# Patient Record
Sex: Male | Born: 1985 | Race: White | Hispanic: No | Marital: Married | Smoking: Never smoker
Health system: Southern US, Community
[De-identification: ages and names within clinical notes are randomized; demographics above are authoritative.]

## PROBLEM LIST (undated history)

## (undated) HISTORY — DX: Other disorders of bilirubin metabolism: E80.6

---

## 2010-07-29 ENCOUNTER — Emergency Department (HOSPITAL_COMMUNITY): Admission: EM | Admit: 2010-07-29 | Discharge: 2010-07-29 | Payer: Self-pay | Admitting: Emergency Medicine

## 2010-12-15 LAB — BASIC METABOLIC PANEL
BUN: 10 mg/dL (ref 6–23)
CO2: 27 mEq/L (ref 19–32)
Chloride: 105 mEq/L (ref 96–112)
Creatinine, Ser: 1.03 mg/dL (ref 0.4–1.5)
Glucose, Bld: 86 mg/dL (ref 70–99)

## 2010-12-15 LAB — DIFFERENTIAL
Basophils Relative: 2 % — ABNORMAL HIGH (ref 0–1)
Eosinophils Absolute: 0 10*3/uL (ref 0.0–0.7)
Eosinophils Relative: 1 % (ref 0–5)
Lymphs Abs: 1.2 10*3/uL (ref 0.7–4.0)

## 2010-12-15 LAB — CBC
MCH: 29.9 pg (ref 26.0–34.0)
MCHC: 34.5 g/dL (ref 30.0–36.0)
MCV: 86.7 fL (ref 78.0–100.0)
Platelets: 191 10*3/uL (ref 150–400)
RBC: 4.58 MIL/uL (ref 4.22–5.81)
RDW: 12.6 % (ref 11.5–15.5)

## 2010-12-15 LAB — URINALYSIS, ROUTINE W REFLEX MICROSCOPIC
Glucose, UA: NEGATIVE mg/dL
Ketones, ur: NEGATIVE mg/dL
pH: 7 (ref 5.0–8.0)

## 2010-12-15 LAB — POCT CARDIAC MARKERS

## 2014-03-28 HISTORY — DX: Other disorders of bilirubin metabolism: E80.6

## 2014-04-21 DIAGNOSIS — D239 Other benign neoplasm of skin, unspecified: Secondary | ICD-10-CM

## 2014-04-21 HISTORY — DX: Other benign neoplasm of skin, unspecified: D23.9

## 2015-12-24 DIAGNOSIS — M5459 Other low back pain: Secondary | ICD-10-CM | POA: Insufficient documentation

## 2015-12-24 DIAGNOSIS — M545 Low back pain: Secondary | ICD-10-CM | POA: Insufficient documentation

## 2017-05-02 ENCOUNTER — Other Ambulatory Visit: Payer: Self-pay

## 2017-05-02 DIAGNOSIS — Z0000 Encounter for general adult medical examination without abnormal findings: Secondary | ICD-10-CM

## 2017-05-02 LAB — POCT URINALYSIS DIPSTICK
Bilirubin, UA: NEGATIVE
GLUCOSE UA: NEGATIVE
KETONES UA: NEGATIVE
Leukocytes, UA: NEGATIVE
Nitrite, UA: NEGATIVE
PROTEIN UA: NEGATIVE
RBC UA: NEGATIVE
SPEC GRAV UA: 1.02 (ref 1.010–1.025)
UROBILINOGEN UA: 0.2 U/dL
pH, UA: 6 (ref 5.0–8.0)

## 2017-05-03 LAB — CMP12+LP+TP+TSH+6AC+CBC/D/PLT
ALK PHOS: 81 IU/L (ref 39–117)
ALT: 33 IU/L (ref 0–44)
AST: 25 IU/L (ref 0–40)
Albumin/Globulin Ratio: 1.7 (ref 1.2–2.2)
Albumin: 4.3 g/dL (ref 3.5–5.5)
BILIRUBIN TOTAL: 1.1 mg/dL (ref 0.0–1.2)
BUN/Creatinine Ratio: 14 (ref 9–20)
BUN: 14 mg/dL (ref 6–20)
Basophils Absolute: 0 10*3/uL (ref 0.0–0.2)
Basos: 1 %
CHLORIDE: 102 mmol/L (ref 96–106)
CHOLESTEROL TOTAL: 159 mg/dL (ref 100–199)
Calcium: 9.3 mg/dL (ref 8.7–10.2)
Chol/HDL Ratio: 3.1 ratio (ref 0.0–5.0)
Creatinine, Ser: 1.02 mg/dL (ref 0.76–1.27)
EOS (ABSOLUTE): 0.2 10*3/uL (ref 0.0–0.4)
Eos: 3 %
Estimated CHD Risk: <0.5 times avg. (ref 0.0–1.0)
FREE THYROXINE INDEX: 1.5 (ref 1.2–4.9)
GFR calc Af Amer: 113 mL/min/{1.73_m2} (ref >59)
GFR calc non Af Amer: 97 mL/min/{1.73_m2} (ref >59)
GGT: 21 IU/L (ref 0–65)
GLUCOSE: 92 mg/dL (ref 65–99)
Globulin, Total: 2.6 g/dL (ref 1.5–4.5)
HDL: 51 mg/dL (ref >39)
HEMATOCRIT: 46.2 % (ref 37.5–51.0)
Hemoglobin: 16.1 g/dL (ref 13.0–17.7)
IMMATURE GRANS (ABS): 0 10*3/uL (ref 0.0–0.1)
IMMATURE GRANULOCYTES: 0 %
Iron: 112 ug/dL (ref 38–169)
LDH: 170 IU/L (ref 121–224)
LDL CALC: 81 mg/dL (ref 0–99)
LYMPHS: 33 %
Lymphocytes Absolute: 2 10*3/uL (ref 0.7–3.1)
MCH: 30.3 pg (ref 26.6–33.0)
MCHC: 34.8 g/dL (ref 31.5–35.7)
MCV: 87 fL (ref 79–97)
MONOS ABS: 0.6 10*3/uL (ref 0.1–0.9)
Monocytes: 10 %
NEUTROS PCT: 53 %
Neutrophils Absolute: 3.3 10*3/uL (ref 1.4–7.0)
PLATELETS: 270 10*3/uL (ref 150–379)
Phosphorus: 4.5 mg/dL (ref 2.5–4.5)
Potassium: 4.4 mmol/L (ref 3.5–5.2)
RBC: 5.31 x10E6/uL (ref 4.14–5.80)
RDW: 13.6 % (ref 12.3–15.4)
Sodium: 142 mmol/L (ref 134–144)
T3 Uptake Ratio: 23 % — ABNORMAL LOW (ref 24–39)
T4 TOTAL: 6.4 ug/dL (ref 4.5–12.0)
TRIGLYCERIDES: 136 mg/dL (ref 0–149)
TSH: 1.1 u[IU]/mL (ref 0.450–4.500)
Total Protein: 6.9 g/dL (ref 6.0–8.5)
URIC ACID: 3.9 mg/dL (ref 3.7–8.6)
VLDL Cholesterol Cal: 27 mg/dL (ref 5–40)
WBC: 6.2 10*3/uL (ref 3.4–10.8)

## 2017-05-03 LAB — VITAMIN D 25 HYDROXY (VIT D DEFICIENCY, FRACTURES): Vit D, 25-Hydroxy: 32.4 ng/mL (ref 30.0–100.0)

## 2017-05-15 ENCOUNTER — Ambulatory Visit: Payer: Self-pay | Admitting: Medical

## 2017-05-17 ENCOUNTER — Encounter: Payer: Self-pay | Admitting: Adult Health

## 2017-05-17 ENCOUNTER — Other Ambulatory Visit: Payer: Self-pay | Admitting: Adult Health

## 2017-05-17 NOTE — Progress Notes (Unsigned)
Subjective:     Patient ID: Javier Burgess, male   DOB: 09/16/1986, 31 y.o.   MRN: [id number]  HPI Patient is a 31 year old male who presents to the clinic for a wellness exam.  Allergies  Allergen Reactions  . No Known Allergies     Review of Systems     Objective:   Physical Exam     Recent Results (from the past 2160 hour(s))  CMP12+LP+TP+TSH+6AC+CBC/D/Plt     Status: Abnormal   Collection Time: 05/02/17  8:34 AM  Result Value Ref Range   Glucose 92 65 - 99 mg/dL   Uric Acid 3.9 3.7 - 8.6 mg/dL    Comment:            Therapeutic target for gout patients: <6.0   BUN 14 6 - 20 mg/dL   Creatinine, Ser 1.02 0.76 - 1.27 mg/dL   GFR calc non Af Amer 97 >59 mL/min/1.73   GFR calc Af Amer 113 >59 mL/min/1.73   BUN/Creatinine Ratio 14 9 - 20   Sodium 142 134 - 144 mmol/L   Potassium 4.4 3.5 - 5.2 mmol/L   Chloride 102 96 - 106 mmol/L   Calcium 9.3 8.7 - 10.2 mg/dL   Phosphorus 4.5 2.5 - 4.5 mg/dL   Total Protein 6.9 6.0 - 8.5 g/dL   Albumin 4.3 3.5 - 5.5 g/dL   Globulin, Total 2.6 1.5 - 4.5 g/dL   Albumin/Globulin Ratio 1.7 1.2 - 2.2   Bilirubin Total 1.1 0.0 - 1.2 mg/dL   Alkaline Phosphatase 81 39 - 117 IU/L   LDH 170 121 - 224 IU/L   AST 25 0 - 40 IU/L   ALT 33 0 - 44 IU/L   GGT 21 0 - 65 IU/L   Iron 112 38 - 169 ug/dL   Cholesterol, Total 159 100 - 199 mg/dL   Triglycerides 136 0 - 149 mg/dL   HDL 51 >39 mg/dL   VLDL Cholesterol Cal 27 5 - 40 mg/dL   LDL Calculated 81 0 - 99 mg/dL   Chol/HDL Ratio 3.1 0.0 - 5.0 ratio    Comment:                                   T. Chol/HDL Ratio                                             Men  Women                               1/2 Avg.Risk  3.4    3.3                                   Avg.Risk  5.0    4.4                                2X Avg.Risk  9.6    7.1                                3X  Avg.Risk 23.4   11.0    Estimated CHD Risk  < 0.5 0.0 - 1.0 times avg.    Comment:                                   T. Chol/HDL Ratio                                             Men  Women                               1/2 Avg.Risk  3.4    3.3                                   Avg.Risk  5.0    4.4                                2X Avg.Risk  9.6    7.1                                3X Avg.Risk 23.4   11.0 The CHD Risk is based on the T. Chol/HDL ratio.  Other factors affect CHD Risk such as hypertension, smoking, diabetes, severe obesity, and family history of pre- mature CHD.    TSH 1.100 0.450 - 4.500 uIU/mL   T4, Total 6.4 4.5 - 12.0 ug/dL   T3 Uptake Ratio 23 (L) 24 - 39 %   Free Thyroxine Index 1.5 1.2 - 4.9   WBC 6.2 3.4 - 10.8 x10E3/uL   RBC 5.31 4.14 - 5.80 x10E6/uL   Hemoglobin 16.1 13.0 - 17.7 g/dL   Hematocrit 46.2 37.5 - 51.0 %   MCV 87 79 - 97 fL   MCH 30.3 26.6 - 33.0 pg   MCHC 34.8 31.5 - 35.7 g/dL   RDW 13.6 12.3 - 15.4 %   Platelets 270 150 - 379 x10E3/uL   Neutrophils 53 Not Estab. %   Lymphs 33 Not Estab. %   Monocytes 10 Not Estab. %   Eos 3 Not Estab. %   Basos 1 Not Estab. %   Neutrophils Absolute 3.3 1.4 - 7.0 x10E3/uL   Lymphocytes Absolute 2.0 0.7 - 3.1 x10E3/uL   Monocytes Absolute 0.6 0.1 - 0.9 x10E3/uL   EOS (ABSOLUTE) 0.2 0.0 - 0.4 x10E3/uL   Basophils Absolute 0.0 0.0 - 0.2 x10E3/uL   Immature Granulocytes 0 Not Estab. %   Immature Grans (Abs) 0.0 0.0 - 0.1 x10E3/uL  VITAMIN D 25 Hydroxy (Vit-D Deficiency, Fractures)     Status: None   Collection Time: 05/02/17  8:34 AM  Result Value Ref Range   Vit D, 25-Hydroxy 32.4 30.0 - 100.0 ng/mL    Comment: Vitamin D deficiency has been defined by the Institute of Medicine and an Endocrine Society practice guideline as a level of serum 25-OH vitamin D less than 20 ng/mL (1,2). The Endocrine Society went on to further define vitamin D insufficiency as a level between 21 and 29 ng/mL (2). 1. IOM (Institute of Medicine). 2010. Dietary reference  intakes for calcium and D. [place]: The    [employer]. 2. Holick MF,  Binkley Erlanger, Bischoff-Ferrari HA, et al.    Evaluation, treatment, and prevention of vitamin D    deficiency: an Endocrine Society clinical practice    guideline. JCEM. 2011 Jul; 96(7):1911-30.   POCT urinalysis dipstick     Status: Normal   Collection Time: 05/02/17  8:56 AM  Result Value Ref Range   Color, UA LT YELLOW    Clarity, UA CLEAR    Glucose, UA NEG    Bilirubin, UA NEG    Ketones, UA NEG    Spec Grav, UA 1.020 1.010 - 1.025   Blood, UA NEG    pH, UA 6.0 5.0 - 8.0   Protein, UA NEG    Urobilinogen, UA 0.2 0.2 or 1.0 E.U./dL   Nitrite, UA NEG    Leukocytes, UA Negative Negative    Assessment:     ***    Plan:     ***

## 2017-05-18 ENCOUNTER — Ambulatory Visit: Payer: Self-pay | Admitting: Adult Health

## 2017-05-18 ENCOUNTER — Encounter: Payer: Self-pay | Admitting: Adult Health

## 2017-05-18 VITALS — BP 122/78 | HR 73 | Temp 98.4°F | Ht 73.5 in | Wt 206.8 lb

## 2017-05-18 DIAGNOSIS — E559 Vitamin D deficiency, unspecified: Secondary | ICD-10-CM

## 2017-05-18 DIAGNOSIS — Z0000 Encounter for general adult medical examination without abnormal findings: Secondary | ICD-10-CM

## 2017-05-18 MED ORDER — TETANUS-DIPHTH-ACELL PERTUSSIS 5-2.5-18.5 LF-MCG/0.5 IM SUSP
0.5000 mL | Freq: Once | INTRAMUSCULAR | Status: AC
  Administered 2017-05-18: 0.5 mL via INTRAMUSCULAR

## 2017-05-18 MED ORDER — NYSTATIN 100000 UNIT/GM EX CREA: 1.0000 "application " | TOPICAL_CREAM | Freq: Two times a day (BID) | CUTANEOUS | 1 refills | Status: AC

## 2017-05-18 MED ORDER — TETANUS-DIPHTH-ACELL PERTUSSIS 5-2.5-18.5 LF-MCG/0.5 IM SUSP: 0.5000 mL | Freq: Once | INTRAMUSCULAR | Status: DC

## 2017-05-18 NOTE — Addendum Note (Signed)
Addended by: Doreen Beam on: 05/18/2017 01:52 PM   Modules accepted: Orders

## 2017-05-18 NOTE — Patient Instructions (Addendum)
DTaP Vaccine (Diphtheria, Tetanus, and Pertussis): What You Need to Know 1. Why get vaccinated? Diphtheria, tetanus, and pertussis are serious diseases caused by bacteria. Diphtheria and pertussis are spread from person to person. Tetanus enters the body through cuts or wounds. DIPHTHERIA causes a thick covering in the back of the throat.  It can lead to breathing problems, paralysis, heart failure, and even death.  TETANUS (Lockjaw) causes painful tightening of the muscles, usually all over the body.  It can lead to "locking" of the jaw so the victim cannot open his mouth or swallow. Tetanus leads to death in up to 2 out of 10 cases.  PERTUSSIS (Whooping Cough) causes coughing spells so bad that it is hard for infants to eat, drink, or breathe. These spells can last for weeks.  It can lead to pneumonia, seizures (jerking and staring spells), brain damage, and death.  Diphtheria, tetanus, and pertussis vaccine (DTaP) can help prevent these diseases. Most children who are vaccinated with DTaP will be protected throughout childhood. Many more children would get these diseases if we stopped vaccinating. DTaP is a safer version of an older vaccine called DTP. DTP is no longer used in the [place]. 2. Who should get DTaP vaccine and when? Children should get 5 doses of DTaP vaccine, one dose at each of the following ages:  2 months  4 months  6 months  15-18 months  4-6 years  DTaP may be given at the same time as other vaccines. 3. Some children should not get DTaP vaccine or should wait  Children with minor illnesses, such as a cold, may be vaccinated. But children who are moderately or severely ill should usually wait until they recover before getting DTaP vaccine.  Any child who had a life-threatening allergic reaction after a dose of DTaP should not get another dose.  Any child who suffered a brain or nervous system disease within 7 days after a dose of DTaP should not get  another dose.  Talk with your doctor if your child: ? had a seizure or collapsed after a dose of DTaP, ? cried non-stop for 3 hours or more after a dose of DTaP, ? had a fever over 105F after a dose of DTaP. Ask your doctor for more information. Some of these children should not get another dose of pertussis vaccine, but may get a vaccine without pertussis, called DT. 4. Older children and adults DTaP is not licensed for adolescents, adults, or children 83 years of age and older. But older people still need protection. A vaccine called Tdap is similar to DTaP. A single dose of Tdap is recommended for people 11 through 31 years of age. Another vaccine, called Td, protects against tetanus and diphtheria, but not pertussis. It is recommended every 10 years. There are separate Vaccine Information Statements for these vaccines. 5. What are the risks from DTaP vaccine? Getting diphtheria, tetanus, or pertussis disease is much riskier than getting DTaP vaccine. However, a vaccine, like any medicine, is capable of causing serious problems, such as severe allergic reactions. The risk of DTaP vaccine causing serious harm, or death, is extremely small. Mild problems (common)  Fever (up to about 1 child in 4)  Redness or swelling where the shot was given (up to about 1 child in 4)  Soreness or tenderness where the shot was given (up to about 1 child in 4) These problems occur more often after the 4th and 5th doses of the DTaP series than after  earlier doses. Sometimes the 4th or 5th dose of DTaP vaccine is followed by swelling of the entire arm or leg in which the shot was given, lasting 1-7 days (up to about 1 child in 45). Other mild problems include:  Fussiness (up to about 1 child in 3)  Tiredness or poor appetite (up to about 1 child in 10)  Vomiting (up to about 1 child in 5) These problems generally occur 1-3 days after the shot. Moderate problems (uncommon)  Seizure (jerking or staring)  (about 1 child out of 14,000)  Non-stop crying, for 3 hours or more (up to about 1 child out of 1,000)  High fever, over 105F (about 1 child out of 16,000) Severe problems (very rare)  Serious allergic reaction (less than 1 out of a million doses)  Several other severe problems have been reported after DTaP vaccine. These include: ? Long-term seizures, coma, or lowered consciousness ? Permanent brain damage. These are so rare it is hard to tell if they are caused by the vaccine. Controlling fever is especially important for children who have had seizures, for any reason. It is also important if another family member has had seizures. You can reduce fever and pain by giving your child an aspirin-free pain reliever when the shot is given, and for the next 24 hours, following the package instructions. 6. What if there is a serious reaction? What should I look for? Look for anything that concerns you, such as signs of a severe allergic reaction, very high fever, or behavior changes. Signs of a severe allergic reaction can include hives, swelling of the face and throat, difficulty breathing, a fast heartbeat, dizziness, and weakness. These would start a few minutes to a few hours after the vaccination. What should I do?  If you think it is a severe allergic reaction or other emergency that can't wait, call 9-1-1 or get the person to the nearest hospital. Otherwise, call your doctor.  Afterward, the reaction should be reported to the Vaccine Adverse Event Reporting System (VAERS). Your doctor might file this report, or you can do it yourself through the VAERS web site at www.vaers.SamedayNews.es, or by calling (304) 240-4588. ? VAERS is only for reporting reactions. They do not give medical advice. 7. The National Vaccine Injury Compensation Program The [employer] Vaccine Injury Compensation Program (VICP) is a federal program that was created to compensate people who may have been injured by certain  vaccines. Persons who believe they may have been injured by a vaccine can learn about the program and about filing a claim by calling 847 374 8098 or visiting the [place] website at GoldCloset.com.ee. 8. How can I learn more?  Ask your doctor.  Call your local or state health department.  Contact the Centers for Disease Control and Prevention (CDC): ? Call 628-609-5062 (1-800-CDC-INFO) or ? Visit CDC's website at http://hunter.com/ CDC DTaP Vaccine (Diphtheria, Tetanus, and Pertussis) VIS (02/16/06) This information is not intended to replace advice given to you by your health care provider. Make sure you discuss any questions you have with your health care provider. Document Released: 07/17/2006 Document Revised: 06/09/2016 Document Reviewed: 06/09/2016 Elsevier Interactive Patient Education  2017 [employer]. Cholesterol Cholesterol is a fat. Your body needs a small amount of cholesterol. Cholesterol (plaque) may build up in your blood vessels (arteries). That makes you more likely to have a heart attack or stroke. You cannot feel your cholesterol level. Having a blood test is the only way to find out if your level is  high. Keep your test results. Work with your doctor to keep your cholesterol at a good level. What do the results mean?  Total cholesterol is how much cholesterol is in your blood.  LDL is bad cholesterol. This is the type that can build up. Try to have low LDL.  HDL is good cholesterol. It cleans your blood vessels and carries LDL away. Try to have high HDL.  Triglycerides are fat that the body can store or burn for energy. What are good levels of cholesterol?  Total cholesterol below 200.  LDL below 100 is good for people who have health risks. LDL below 70 is good for people who have very high risks.  HDL above 40 is good. It is best to have HDL of 60 or higher.  Triglycerides below 150. How can I lower my cholesterol? Diet Follow your diet  program as told by your doctor.  Choose fish, white meat chicken, or [place] that is roasted or baked. Try not to eat red meat, fried foods, sausage, or lunch meats.  Eat lots of fresh fruits and vegetables.  Choose whole grains, beans, pasta, potatoes, and cereals.  Choose olive oil, corn oil, or canola oil. Only use small amounts.  Try not to eat butter, mayonnaise, shortening, or palm kernel oils.  Try not to eat foods with trans fats.  Choose low-fat or nonfat dairy foods. ? Drink skim or nonfat milk. ? Eat low-fat or nonfat yogurt and cheeses. ? Try not to drink whole milk or cream. ? Try not to eat ice cream, egg yolks, or full-fat cheeses.  Healthy desserts include angel food cake, ginger snaps, animal crackers, hard candy, popsicles, and low-fat or nonfat frozen yogurt. Try not to eat pastries, cakes, pies, and cookies.  Exercise Follow your exercise program as told by your doctor.  Be more active. Try gardening, walking, and taking the stairs.  Ask your doctor about ways that you can be more active.  Medicine  Take over-the-counter and prescription medicines only as told by your doctor.  This information is not intended to replace advice given to you by your health care provider. Make sure you discuss any questions you have with your health care provider. Document Released: 12/16/2008 Document Revised: 04/20/2016 Document Reviewed: 03/31/2016 Elsevier Interactive Patient Education  2017 [place] Jock itch is an infection of the skin in the groin area. It is caused by a type of germ (fungus). The infection causes a rash and itching in the groin and upper thigh. It is common in people who play sports. Being in places with hot weather and wearing tight or wet clothes can increase the chance of getting jock itch. The rash usually goes away in 2-3 weeks with treatment. Follow these instructions at home:  Take medicines only as told by your doctor. Apply skin  creams or ointments exactly as told.  Wear loose-fitting clothing. ? Men should wear cotton boxer shorts. ? Women should wear cotton underwear.  Change your underwear every day to keep your groin dry.  Avoid hot baths.  Dry your groin area well after you take a bath or shower. ? Use a separate towel to dry your groin area. This will help to prevent a spreading of the infection to other areas of your body.  Do not scratch the affected area.  Do not share towels with other people. Contact a doctor if:  Your rash does not improve or it gets worse after 2 weeks of treatment.  Your rash is spreading.  Your rash comes back after treatment is finished.  You have a fever.  You have redness, swelling, or pain in the area around your rash.  You have fluid, blood, or pus coming from your rash.  Your have your rash for more than 4 weeks. This information is not intended to replace advice given to you by your health care provider. Make sure you discuss any questions you have with your health care provider. Document Released: 12/14/2009 Document Revised: 02/25/2016 Document Reviewed: 07/01/2014 Elsevier Interactive Patient Education  2018 [employer]. Vitamin D Deficiency Vitamin D deficiency is when your body does not have enough vitamin D. Vitamin D is important to your body for many reasons:  It helps the body to absorb two important minerals, called calcium and phosphorus.  It plays a role in bone health.  It may help to prevent some diseases, such as diabetes and multiple sclerosis.  It plays a role in muscle function, including heart function.  You can get vitamin D by:  Eating foods that naturally contain vitamin D.  Eating or drinking milk or other dairy products that have vitamin D added to them.  Taking a vitamin D supplement or a multivitamin supplement that contains vitamin D.  Being in the sun. Your body naturally makes vitamin D when your skin is exposed to  sunlight. Your body changes the sunlight into a form of the vitamin that the body can use.  If vitamin D deficiency is severe, it can cause a condition in which your bones become soft. In adults, this condition is called osteomalacia. In children, this condition is called rickets. What are the causes? Vitamin D deficiency may be caused by:  Not eating enough foods that contain vitamin D.  Not getting enough sun exposure.  Having certain digestive system diseases that make it difficult for your body to absorb vitamin D. These diseases include Crohn disease, chronic pancreatitis, and cystic fibrosis.  Having a surgery in which a part of the stomach or a part of the small intestine is removed.  Being obese.  Having chronic kidney disease or liver disease.  What increases the risk? This condition is more likely to develop in:  Older people.  People who do not spend much time outdoors.  People who live in a long-term care facility.  People who have had broken bones.  People with weak or thin bones (osteoporosis).  People who have a disease or condition that changes how the body absorbs vitamin D.  People who have dark skin.  People who take certain medicines, such as steroid medicines or certain seizure medicines.  People who are overweight or obese.  What are the signs or symptoms? In mild cases of vitamin D deficiency, there may not be any symptoms. If the condition is severe, symptoms may include:  Bone pain.  Muscle pain.  Falling often.  Broken bones caused by a minor injury.  How is this diagnosed? This condition is usually diagnosed with a blood test. How is this treated? Treatment for this condition may depend on what caused the condition. Treatment options include:  Taking vitamin D supplements.  Taking a calcium supplement. Your health care provider will suggest what dose is best for you.  Follow these instructions at home:  Take medicines and  supplements only as told by your health care provider.  Eat foods that contain vitamin D. Choices include: ? Fortified dairy products, cereals, or juices. Fortified means that vitamin D has  been added to the food. Check the label on the package to be sure. ? Fatty fish, such as salmon or trout. ? Eggs. ? Oysters.  Do not use a tanning bed.  Maintain a healthy weight. Lose weight, if needed.  Keep all follow-up visits as told by your health care provider. This is important. Contact a health care provider if:  Your symptoms do not go away.  You feel like throwing up (nausea) or you throw up (vomit).  You have fewer bowel movements than usual or it is difficult for you to have a bowel movement (constipation). This information is not intended to replace advice given to you by your health care provider. Make sure you discuss any questions you have with your health care provider. Document Released: 12/12/2011 Document Revised: 03/02/2016 Document Reviewed: 02/04/2015 Elsevier Interactive Patient Education  2018 [place]. Heart Disease Prevention Heart disease is a leading cause of death. There are many things you can do to help prevent heart disease. Be physically active Physical activity is good for your heart. It helps control your blood pressure, cholesterol levels, and weight. Try to be physically active every day. Ask your health care provider what activities are best for you. Be a healthy weight Extra weight can strain your heart and affect your blood pressure and cholesterol levels. Lose weight with diet and exercise if recommended by your health care provider. Eat heart-healthy foods Follow a healthy eating plan as recommended by your health care provider or dietitian. Heart-healthy foods include:  High-fiber foods. These include oat bran, oatmeal, and whole-grain breads and cereals.  Fruits and vegetables.  Avoid:  Alcohol.  Fried foods.  Foods high in saturated fat.  These include meats, butter, whole dairy products, shortening, and coconut or palm oil.  Salty foods. These include canned food, luncheon meat, salty snacks, and fast food.  Keep your cholesterol levels under control Cholesterol is a substance that is used for many important functions. When your cholesterol levels are high, cholesterol can stick to the insides of your blood vessels, making them narrow or clog. This can lead to chest pain (angina) and a heart attack. Keep your cholesterol levels under control as recommended by your health care provider. Have your cholesterol checked at least once a year. Target cholesterol levels (in mg/dL) for most people are:  Total cholesterol below 200.  LDL cholesterol below 100.  HDL cholesterol above 40 in men and above 50 in women.  Triglycerides below 150.  Keep your blood pressure under control Having high blood pressure (hypertension) puts you at risk for stroke and other forms of heart disease. Keep your blood pressure under control as recommended by your health care provider. Ask your health care provider if you need treatment to lower your blood pressure. If you are 25-97 years of age, have your blood pressure checked every 3-5 years. If you are 23 years of age or older, have your blood pressure checked every year. Do not use tobacco products Tobacco smoke can damage your heart and blood vessels. Do not use any tobacco products including cigarettes, chewing tobacco, or electronic cigarettes. If you need help quitting, ask your health care provider. Take medicines as directed Take medicines only as directed by your health care provider. Ask your health care provider whether you should take an aspirin every day. Taking aspirin can help reduce your risk of heart disease and stroke. Where to find more information: To find out more about heart disease, visit the American Heart Association's  website at www.americanheart.org This information is not  intended to replace advice given to you by your health care provider. Make sure you discuss any questions you have with your health care provider. Document Released: 05/03/2004 Document Revised: 02/17/2016 Document Reviewed: 11/13/2013 Elsevier Interactive Patient Education  2017 [place]. [employer]u Breast Self-Awareness Breast self-awareness means:  Knowing how your breasts look.  Knowing how your breasts feel.  Checking your breasts every month for changes.  Telling your doctor if you notice a change in your breasts.  Breast self-awareness allows you to notice a breast problem early while it is still small. How to do a breast self-exam One way to learn what is normal for your breasts and to check for changes is to do a breast self-exam. To do a breast self-exam: Look for Changes  1. Take off all the clothes above your waist. 2. Stand in front of a mirror in a room with good lighting. 3. Put your hands on your hips. 4. Push your hands down. 5. Look at your breasts and nipples in the mirror to see if one breast or nipple looks different than the other. Check to see if: ? The shape of one breast is different. ? The size of one breast is different. ? There are wrinkles, dips, and bumps in one breast and not the other. 6. Look at each breast for changes in your skin, such as: ? Redness. ? Scaly areas. 7. Look for changes in your nipples, such as: ? Liquid around the nipples. ? Bleeding. ? Dimpling. ? Redness. ? A change in where the nipples are. Feel for Changes 1. Lie on your back on the floor. 2. Feel each breast. To do this, follow these steps: ? Pick a breast to feel. ? Put the arm closest to that breast above your head. ? Use your other arm to feel the nipple area of your breast. Feel the area with the pads of your three middle fingers by making small circles with your fingers. For the first circle, press lightly. For the second circle, press harder. For the third  circle, press even harder. ? Keep making circles with your fingers at the light, harder, and even harder pressures as you move down your breast. Stop when you feel your ribs. ? Move your fingers a little toward the center of your body. ? Start making circles with your fingers again, this time going up until you reach your collarbone. ? Keep making up and down circles until you reach your armpit. Remember to keep using the three pressures. ? Feel the other breast in the same way. 3. Sit or stand in the shower or tub. 4. With soapy water on your skin, feel each breast the same way you did in step 2, when you were lying on the floor. Write Down What You Find  After doing the self-exam, write down:  What is normal for each breast.  Any changes you find in each breast.  When you last had your period.  How often should I check my breasts? Check your breasts every month. If you are breastfeeding, the best time to check them is after you feed your baby or after you use a breast pump. If you get periods, the best time to check your breasts is 5-7 days after your period is over. When should I see my doctor? See your doctor if you notice:  A change in shape or size of your breasts or nipples.  A change in the  skin of your breast or nipples, such as red or scaly skin.  Unusual fluid coming from your nipples.  A lump or thick area that was not there before.  Pain in your breasts.  Anything that concerns you.  This information is not intended to replace advice given to you by your health care provider. Make sure you discuss any questions you have with your health care provider. Document Released: 03/07/2008 Document Revised: 02/25/2016 Document Reviewed: 08/09/2015 Elsevier Interactive Patient Education  2018 [employer].  Testicular Self-Exam A self-exam of your testicles (testicular self-exam) is looking at and feeling your testicles for unusual lumps or swelling. Swelling, lumps, or  pain can be caused by:  Injuries.  Puffiness, redness, and soreness (inflammation).  Infection.  Extra fluids around your testicle (hydrocele).  Twisted testicles (testicular torsion).  Cancer of the testicle (testicular cancer).  Why is it important to do a self-exam of testicles? You may need to do self-exams if you are at risk for cancer of the testicles. You may be at risk if you have:  A testicle that has not descended (cryptorchidism).  A history of cancer of the testicle.  A family history of cancer of the testicle.  How to do a self-exam of testicles It is easiest to do a self-exam after a warm bath or shower. Testicles are harder to examine when you are cold. A normal testicle is egg-shaped and feels firm. It is smooth, and it is not tender. At the back of your testicles, there is a firm cord that feels like spaghetti (spermatic cord). Look and feel for changes  Stand and hold your penis away from your body.  Look at each testicle to check for lumps or swelling.  Roll each testicle between your thumb and finger. Feel the whole testicle. Feel for: ? Lumps. ? Swelling. ? Discomfort.  Check for swelling or tender bumps in the groin area. Your groin is where your lower belly (abdomen) meets your upper thighs. Contact a health care provider if:  You find a bump or lump. This may be like a small, hard bump that is the size of a pea.  You find swelling.  You find pain.  You find soreness.  You see or feel any other changes. Summary  A self-exam of your testicles is looking at and feeling your testicles for lumps or swelling.  You may need to do self-exams if you are at risk for cancer of the testicle.  You should check each of your testicles for lumps, swelling, or discomfort.  You should check for swelling or tender bumps in the groin area. Your groin is where your lower belly (abdomen) meets your upper thighs. This information is not intended to replace  advice given to you by your health care provider. Make sure you discuss any questions you have with your health care provider. Document Released: 12/16/2008 Document Revised: 08/15/2016 Document Reviewed: 08/15/2016 Elsevier Interactive Patient Education  2017 [employer].

## 2017-05-18 NOTE — Progress Notes (Signed)
Subjective:     Patient ID: Javier Burgess, male   DOB: [birth year], 31 y.o.   MRN: [id number]  HPI Patient is a 31 year old male in no acute distress who presents for a wellness exam to establish care. He reports he is married and expecting his first child in four weeks. 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  Exercisers - coaches basketball. He reports he has a itchy rash in his groin and on his inner thighs that has been present x 1 week. He denies any other signs and symptoms or complaints at this time.  He reports being in a monogamous relationship  No surgeries. He has a history of an injury to his right ring finger that was treated and healed years ago.  He reports he sees Dr. Drema Dallas at  Dermatology  yearly for skin check and reports having some benign lesions removed.  He desires to have a TDAP immunization today with baby arriving in 4 weeks. He has had tetanus immunization in the past for college but denies ever having a TDAP. He denies any previous immunization records.   He request information on heart health as he heard of a 37 recently dying of an heart attack. He denies any chest pain, shortness of breath, nausea, vomiting or radiating pain. He just denies any symptoms or other concerns.  He denies ever having a colonoscopy, denies any rectal bleeding, blood in stool or rectal pain.  He does regular testicular exams.   Eye Exam he sees Dr. Chong Sicilian and he  will schedule an eye exam for this year.   Blood pressure 122/78, pulse 73, temperature 98.4 F (36.9 C), temperature source Tympanic, SpO2 98 %. Body mass index is 26.91 kg/m.   Family History  Problem Relation Age of Onset  . Cancer Mother        breast cancer in remission    Social History  Substance Use Topics  . Smoking status: Never Smoker  . Smokeless tobacco: Never Used     Comment: n/a  . Alcohol use 1.2 oz/week    2 Cans of beer per week   Recent Results (from the past 2160 hour(s))    CMP12+LP+TP+TSH+6AC+CBC/D/Plt     Status: Abnormal   Collection Time: 05/02/17  8:34 AM  Result Value Ref Range   Glucose 92 65 - 99 mg/dL   Uric Acid 3.9 3.7 - 8.6 mg/dL    Comment:            Therapeutic target for gout patients: <6.0   BUN 14 6 - 20 mg/dL   Creatinine, Ser 1.02 0.76 - 1.27 mg/dL   GFR calc non Af Amer 97 >59 mL/min/1.73   GFR calc Af Amer 113 >59 mL/min/1.73   BUN/Creatinine Ratio 14 9 - 20   Sodium 142 134 - 144 mmol/L   Potassium 4.4 3.5 - 5.2 mmol/L   Chloride 102 96 - 106 mmol/L   Calcium 9.3 8.7 - 10.2 mg/dL   Phosphorus 4.5 2.5 - 4.5 mg/dL   Total Protein 6.9 6.0 - 8.5 g/dL   Albumin 4.3 3.5 - 5.5 g/dL   Globulin, Total 2.6 1.5 - 4.5 g/dL   Albumin/Globulin Ratio 1.7 1.2 - 2.2   Bilirubin Total 1.1 0.0 - 1.2 mg/dL   Alkaline Phosphatase 81 39 - 117 IU/L   LDH 170 121 - 224 IU/L   AST 25 0 - 40 IU/L   ALT 33 0 - 44 IU/L  GGT 21 0 - 65 IU/L   Iron 112 38 - 169 ug/dL   Cholesterol, Total 159 100 - 199 mg/dL   Triglycerides 136 0 - 149 mg/dL   HDL 51 >39 mg/dL   VLDL Cholesterol Cal 27 5 - 40 mg/dL   LDL Calculated 81 0 - 99 mg/dL   Chol/HDL Ratio 3.1 0.0 - 5.0 ratio    Comment:                                   T. Chol/HDL Ratio                                             Men  Women                               1/2 Avg.Risk  3.4    3.3                                   Avg.Risk  5.0    4.4                                2X Avg.Risk  9.6    7.1                                3X Avg.Risk 23.4   11.0    Estimated CHD Risk  < 0.5 0.0 - 1.0 times avg.    Comment:                                   T. Chol/HDL Ratio                                             Men  Women                               1/2 Avg.Risk  3.4    3.3                                   Avg.Risk  5.0    4.4                                2X Avg.Risk  9.6    7.1                                3X Avg.Risk 23.4   11.0 The CHD Risk is based on the T. Chol/HDL ratio.  Other factors  affect CHD Risk such as hypertension, smoking, diabetes, severe obesity, and family history of pre- mature CHD.    TSH  1.100 0.450 - 4.500 uIU/mL   T4, Total 6.4 4.5 - 12.0 ug/dL   T3 Uptake Ratio 23 (L) 24 - 39 %   Free Thyroxine Index 1.5 1.2 - 4.9   WBC 6.2 3.4 - 10.8 x10E3/uL   RBC 5.31 4.14 - 5.80 x10E6/uL   Hemoglobin 16.1 13.0 - 17.7 g/dL   Hematocrit 46.2 37.5 - 51.0 %   MCV 87 79 - 97 fL   MCH 30.3 26.6 - 33.0 pg   MCHC 34.8 31.5 - 35.7 g/dL   RDW 13.6 12.3 - 15.4 %   Platelets 270 150 - 379 x10E3/uL   Neutrophils 53 Not Estab. %   Lymphs 33 Not Estab. %   Monocytes 10 Not Estab. %   Eos 3 Not Estab. %   Basos 1 Not Estab. %   Neutrophils Absolute 3.3 1.4 - 7.0 x10E3/uL   Lymphocytes Absolute 2.0 0.7 - 3.1 x10E3/uL   Monocytes Absolute 0.6 0.1 - 0.9 x10E3/uL   EOS (ABSOLUTE) 0.2 0.0 - 0.4 x10E3/uL   Basophils Absolute 0.0 0.0 - 0.2 x10E3/uL   Immature Granulocytes 0 Not Estab. %   Immature Grans (Abs) 0.0 0.0 - 0.1 x10E3/uL  VITAMIN D 25 Hydroxy (Vit-D Deficiency, Fractures)     Status: None   Collection Time: 05/02/17  8:34 AM  Result Value Ref Range   Vit D, 25-Hydroxy 32.4 30.0 - 100.0 ng/mL    Comment: Vitamin D deficiency has been defined by the Institute of Medicine and an Endocrine Society practice guideline as a level of serum 25-OH vitamin D less than 20 ng/mL (1,2). The Endocrine Society went on to further define vitamin D insufficiency as a level between 21 and 29 ng/mL (2). 1. IOM (Institute of Medicine). 2010. Dietary reference    intakes for calcium and D. [place]: The    [employer]. 2. Holick MF, [place], Bischoff-Ferrari HA, et al.    Evaluation, treatment, and prevention of vitamin D    deficiency: an Endocrine Society clinical practice    guideline. JCEM. 2011 Jul; 96(7):1911-30.   POCT urinalysis dipstick     Status: Normal   Collection Time: 05/02/17  8:56 AM  Result Value Ref Range   Color, UA LT YELLOW    Clarity,  UA CLEAR    Glucose, UA NEG    Bilirubin, UA NEG    Ketones, UA NEG    Spec Grav, UA 1.020 1.010 - 1.025   Blood, UA NEG    pH, UA 6.0 5.0 - 8.0   Protein, UA NEG    Urobilinogen, UA 0.2 0.2 or 1.0 E.U./dL   Nitrite, UA NEG    Leukocytes, UA Negative Negative   Labs reviewed as above.    Review of Systems  Constitutional: Negative.  Negative for activity change, appetite change, chills, diaphoresis, fatigue, fever and unexpected weight change.  HENT: Negative.  Negative for congestion, dental problem, drooling, ear discharge, ear pain, facial swelling, hearing loss, mouth sores, nosebleeds, postnasal drip, rhinorrhea, sinus pain, sinus pressure, sneezing, sore throat, tinnitus, trouble swallowing and voice change.   Eyes: Negative.  Negative for photophobia, pain, discharge, redness, itching and visual disturbance.  Respiratory: Negative.  Negative for apnea, cough, choking, chest tightness, shortness of breath, wheezing and stridor.   Cardiovascular: Negative.  Negative for chest pain, palpitations and leg swelling.  Gastrointestinal: Negative.  Negative for abdominal distention, abdominal pain, anal bleeding, blood in stool, constipation, diarrhea, nausea, rectal pain and vomiting.  Endocrine: Negative.  Negative for cold intolerance, heat intolerance, polydipsia, polyphagia and polyuria.  Genitourinary: Negative.  Negative for decreased urine volume, difficulty urinating, discharge, dysuria, enuresis, flank pain, frequency, genital sores, hematuria, penile pain, penile swelling, scrotal swelling, testicular pain and urgency.  Musculoskeletal: Negative.  Negative for arthralgias, back pain, gait problem, joint swelling, myalgias, neck pain and neck stiffness.  Skin: Negative.  Negative for color change, pallor, rash (bilateral groin reddness with itching present x 1 week, denies any drainage or warmth. ) and wound.  Allergic/Immunologic: Negative.  Negative for environmental allergies,  food allergies and immunocompromised state.  Neurological: Negative.  Negative for dizziness, tremors, seizures, syncope, facial asymmetry, speech difficulty, weakness, light-headedness, numbness and headaches.  Hematological: Negative.  Negative for adenopathy. Does not bruise/bleed easily.  Psychiatric/Behavioral: Negative.  Negative for agitation, behavioral problems, confusion, decreased concentration, dysphoric mood, hallucinations, self-injury, sleep disturbance and suicidal ideas. The patient is not nervous/anxious and is not hyperactive.        Objective:   Physical Exam  Constitutional: He is oriented to person, place, and time. He appears well-developed and well-nourished. He is active.  Non-toxic appearance. He does not have a sickly appearance. He does not appear ill. No distress.  HENT:  Head: Normocephalic and atraumatic.  Right Ear: Hearing, tympanic membrane, external ear and ear canal normal.  Left Ear: Hearing, tympanic membrane, external ear and ear canal normal.  Nose: Nose normal.  Mouth/Throat: Uvula is midline, oropharynx is clear and moist and mucous membranes are normal. No oropharyngeal exudate.  Eyes: Pupils are equal, round, and reactive to light. Conjunctivae, EOM and lids are normal. Right eye exhibits no discharge. Left eye exhibits no discharge. No scleral icterus.  Neck: Trachea normal, normal range of motion, full passive range of motion without pain and phonation normal. Neck supple. Normal carotid pulses, no hepatojugular reflux and no JVD present. No tracheal tenderness present. Carotid bruit is not present. No tracheal deviation present. No Brudzinski's sign noted. No thyromegaly present.  Cardiovascular: Normal rate, regular rhythm, S1 normal, S2 normal, normal heart sounds, intact distal pulses and normal pulses.  Exam reveals no gallop, no distant heart sounds and no friction rub.   No murmur heard. Pulmonary/Chest: Effort normal and breath sounds normal.  No accessory muscle usage or stridor. No respiratory distress. He has no wheezes. He has no rales. He exhibits no tenderness.  Abdominal: Soft. Normal appearance, normal aorta and bowel sounds are normal. He exhibits no distension and no mass. There is no tenderness. There is no rebound and no guarding. Hernia confirmed negative in the right inguinal area and confirmed negative in the left inguinal area.  Genitourinary: Testes normal and penis normal. Cremasteric reflex is present. Right testis shows no mass, no swelling and no tenderness. Right testis is descended. Cremasteric reflex is not absent on the right side. Left testis shows no mass, no swelling and no tenderness. Left testis is descended. Cremasteric reflex is not absent on the left side. Circumcised. No phimosis, paraphimosis, hypospadias, penile erythema or penile tenderness. No discharge found.  Genitourinary Comments: Prostate Exam deferred as no symptoms, and not needed per current practice guideline. Patient educated on symptoms that warrant need.   Musculoskeletal: Normal range of motion. He exhibits no edema, tenderness or deformity.  Lymphadenopathy:       Head (right side): No submental, no submandibular, no tonsillar, no preauricular, no posterior auricular and no occipital adenopathy present.       Head (left side): No submental, no submandibular, no tonsillar,  no preauricular, no posterior auricular and no occipital adenopathy present.    He has no cervical adenopathy.    He has no axillary adenopathy.       Right: No inguinal adenopathy present.       Left: No inguinal adenopathy present.  Neurological: He is alert and oriented to person, place, and time. He has normal strength and normal reflexes. He displays no atrophy and no tremor. No cranial nerve deficit or sensory deficit. He exhibits normal muscle tone. He displays a negative Romberg sign. He displays no seizure activity. Coordination and gait normal. GCS eye subscore is  4. GCS verbal subscore is 5. GCS motor subscore is 6.  Skin: Skin is warm, dry and intact. Rash noted. Rash is macular (bilateral groin and upper inner thigh, no drainage or warmth, light pink in color ) and urticarial (occasionally). He is not diaphoretic. No cyanosis or erythema. No pallor. Nails show no clubbing.     Psychiatric: He has a normal mood and affect. His speech is normal and behavior is normal. Judgment and thought content normal. Cognition and memory are normal.  Engages in conversation, pleasant. Laughs and smiles. Excited for new baby due in 4 weeks.   Vitals reviewed.      Assessment:    Annual physical exam - Plan: Tdap (BOOSTRIX) injection 0.5 mL  Vitamin D deficiency     Plan:    1. Recheck Vitamin D lab  in 6 months.  Add Vitamin D over the counter 2000 IU  I have already ordered this as a future order in the computer.   2. [place] education hand out given on heart healthy diet / exercise, Vitamin D handout given as well.  3. Tinea Crucis : Mild to inner thighs and groin. Will treat with Nystatin, may use over the counter powders for jock itch as well. Keep area clean and dry. Shower after sweating and exercise.Return to clinic if any signs of broken, cracked skin, redness, swelling, drainage, fever or warmth or any other signs of infection.  E- Prescribed Meds ordered this encounter  Medications  . Tdap (BOOSTRIX) injection 0.5 mL  . nystatin cream (MYCOSTATIN)    Sig: Apply 1 application topically 2 (two) times daily. To groin and inner thighs    Dispense:  30 g    Refill:  1   Tdap immunization  given in office by MA Durward Mallard, patient tolerated well and remained in office post - immunization x 15 minutes without any signs and symptoms of a reactions.    3. Return to clinic in 6 months for lab work and as needed for any issues. Return for yearly Physical in 2019 - August.   Patient will call the office should any questions or concerns arise and seek   911 / emergency room / or urgent care if symptoms arise and office is closed or not answering. Patient educated on all of the above and verbalized understanding and denies any further questions at this time

## 2018-02-15 ENCOUNTER — Encounter: Payer: Self-pay | Admitting: Family Medicine

## 2018-02-15 ENCOUNTER — Ambulatory Visit (INDEPENDENT_AMBULATORY_CARE_PROVIDER_SITE_OTHER): Payer: Self-pay | Admitting: Family Medicine

## 2018-02-15 DIAGNOSIS — M25552 Pain in left hip: Secondary | ICD-10-CM

## 2018-02-15 DIAGNOSIS — M5432 Sciatica, left side: Secondary | ICD-10-CM

## 2018-02-22 ENCOUNTER — Other Ambulatory Visit: Payer: Self-pay | Admitting: Family Medicine

## 2018-02-22 ENCOUNTER — Ambulatory Visit
Admission: RE | Admit: 2018-02-22 | Discharge: 2018-02-22 | Disposition: A | Discharge status: Home / Self Care | Payer: BLUE CROSS/BLUE SHIELD | Source: Ambulatory Visit | Attending: Family Medicine | Admitting: Family Medicine

## 2018-02-22 DIAGNOSIS — R52 Pain, unspecified: Secondary | ICD-10-CM

## 2018-02-23 ENCOUNTER — Other Ambulatory Visit: Payer: Self-pay | Admitting: Family Medicine

## 2018-02-23 DIAGNOSIS — M79605 Pain in left leg: Principal | ICD-10-CM

## 2018-02-23 DIAGNOSIS — M545 Low back pain: Secondary | ICD-10-CM

## 2018-03-07 ENCOUNTER — Ambulatory Visit
Admission: RE | Admit: 2018-03-07 | Discharge: 2018-03-07 | Disposition: A | Discharge status: Home / Self Care | Payer: BLUE CROSS/BLUE SHIELD | Source: Ambulatory Visit | Attending: Family Medicine | Admitting: Family Medicine

## 2018-03-07 DIAGNOSIS — M79605 Pain in left leg: Principal | ICD-10-CM

## 2018-03-07 DIAGNOSIS — M545 Low back pain: Secondary | ICD-10-CM

## 2018-03-16 ENCOUNTER — Other Ambulatory Visit: Payer: Self-pay | Admitting: Family Medicine

## 2018-03-16 DIAGNOSIS — M25552 Pain in left hip: Principal | ICD-10-CM

## 2018-03-16 DIAGNOSIS — G8929 Other chronic pain: Secondary | ICD-10-CM

## 2018-03-17 ENCOUNTER — Other Ambulatory Visit: Payer: Self-pay | Admitting: Family Medicine

## 2018-03-17 DIAGNOSIS — R1030 Lower abdominal pain, unspecified: Secondary | ICD-10-CM

## 2018-03-17 DIAGNOSIS — M25559 Pain in unspecified hip: Secondary | ICD-10-CM

## 2018-03-27 ENCOUNTER — Ambulatory Visit
Admission: RE | Admit: 2018-03-27 | Discharge: 2018-03-27 | Disposition: A | Discharge status: Home / Self Care | Payer: BLUE CROSS/BLUE SHIELD | Source: Ambulatory Visit | Attending: Family Medicine | Admitting: Family Medicine

## 2018-03-27 DIAGNOSIS — R1030 Lower abdominal pain, unspecified: Secondary | ICD-10-CM

## 2018-03-27 DIAGNOSIS — M25559 Pain in unspecified hip: Secondary | ICD-10-CM

## 2018-03-27 MED ORDER — IOPAMIDOL (ISOVUE-M 200) INJECTION 41%
15.0000 mL | Freq: Once | INTRAMUSCULAR | Status: AC
  Administered 2018-03-27: 15 mL via INTRA_ARTICULAR

## 2018-05-22 NOTE — Progress Notes (Signed)
Patient presents today with symptoms of chronic left lower back and left hip pain. Patient is unsure whether visit to problems are connected. He states that his symptoms started 5 or 6 years ago he believes after sustaining a groin/hip flexor injury. His symptoms are intermittent but of recent has become more persistent. He states that the symptoms at times go down into his left gluteal area and posterior thigh. Denies any symptoms in his lower legs or feet. Patient is athletic and likes to play sports such as basketball and golf. He notices symptoms with certain motions such as swinging or leaning forward. He has not had any imaging done over the last few years. He denies any fever, chills, weight loss. He denies any significant chronic medical issues.  ROS: Negative except mentioned above.  GENERAL: NAD MSK: Back - no significant midline tenderness of lumbar spine, minimal tenderness of left SI, no tenderness at the ischial tuberosity, full range of motion, negative straight leg raise, mildly tight hamstrings, NV intact  Left Hip - tenderness to palpation, full range of motion, mild discomfort with internal rotation, NV intact NEURO: CN II-XII grossly intact    A/P: Left Lower Back/Left Hip Pain - discussed with patient the uncertainty of the etiology, symptoms could be coming from a discogenic source or could be isolated to only the hip, recommend first starting with imaging of the lumbar spine and then doing the hip if needed, would recommend that patient see PT as well for evaluation/treatment, patient states that he is between jobs right now and is unsure if he will be able to start PT at this time, NSAIDs when necessary, seek medical attention if symptoms persist/worsen, recommend avoiding activities that cause increase in symptoms. I will be in touch with the patient after the imaging has been done and refer him to orthopedics if needed.

## 2018-11-13 IMAGING — MR MR HIP*L* W/CM
4 of 5 series · 21 of 40 positions shown · IV contrast (agent unspecified)
Comparison: Left hip x-rays dated February 22, 2018.

CLINICAL DATA: Chronic, intermittent left gluteal and hip pain with
weakness for the past 5-6 years.

EXAM:
MRI OF THE LEFT HIP WITH CONTRAST (MR ARTHROGRAM)
TECHNIQUE: Multiplanar, multisequence MR imaging was performed following the
administration of intra-articular contrast.
CONTRAST:  See injection documentation.

[Series 3: T1 fat-sat · coronal · 4.0mm · 0.47mm/px · 8 of 20 slices shown (1 of 2)]
[im 1/20]
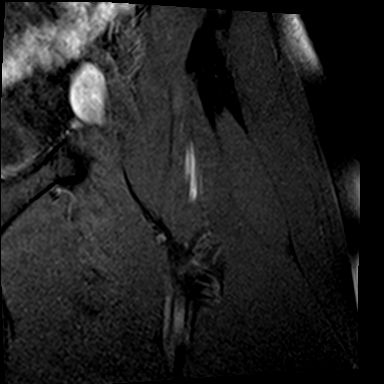
[im 3/20]
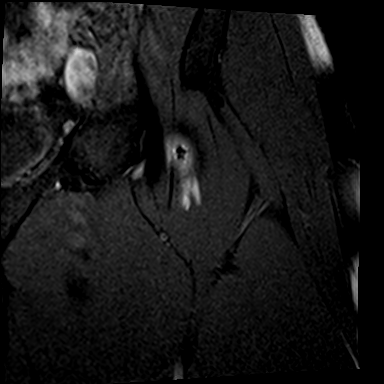
[im 6/20]
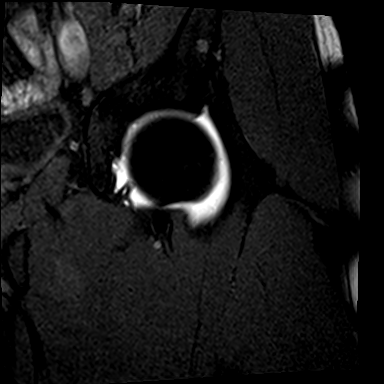
[im 9/20]
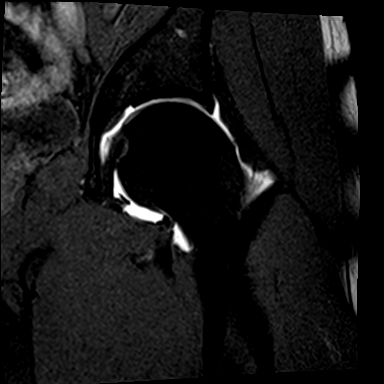
[im 11/20]
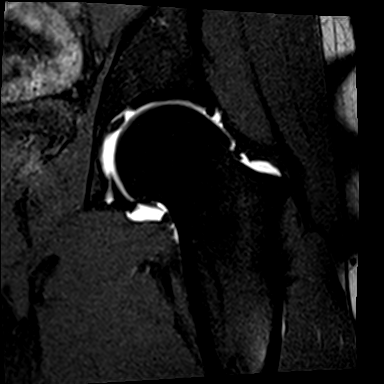
[im 14/20]
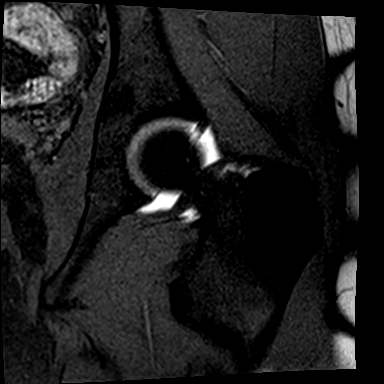
[im 17/20]
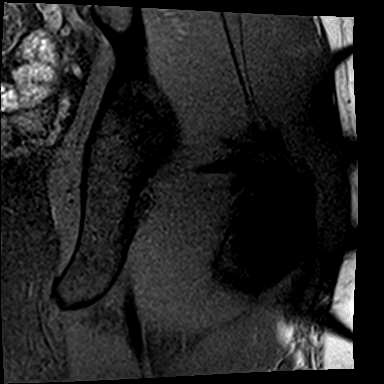
[im 20/20]
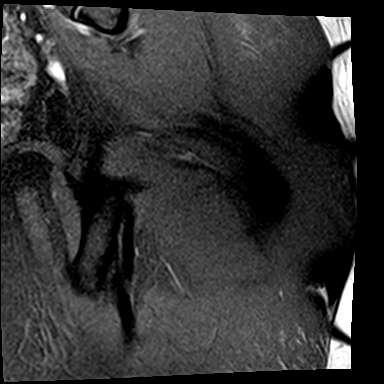

[Series 4: T1 fat-sat · axial · 4.0mm · 0.47mm/px · z∈[-48,+29]mm · 7 of 20 slices shown (2 of 2)]
[im 1/20]
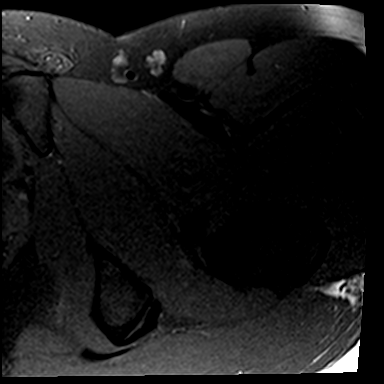
[im 4/20]
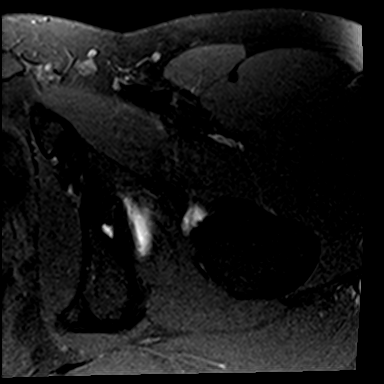
[im 7/20]
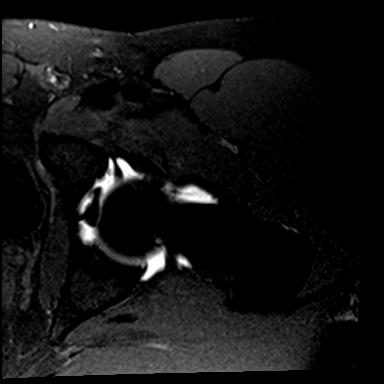
[im 10/20]
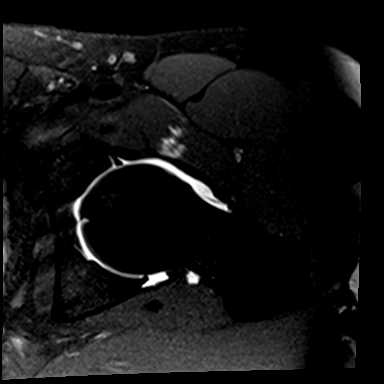
[im 13/20]
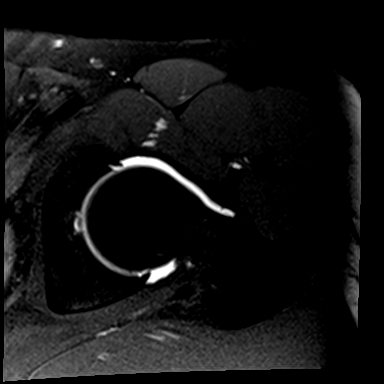
[im 16/20]
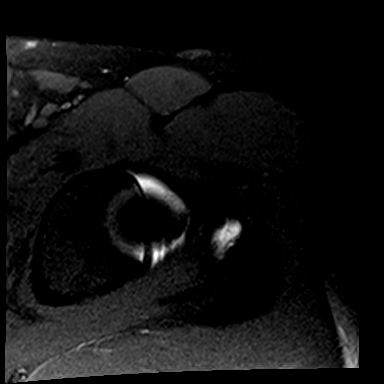
[im 20/20]
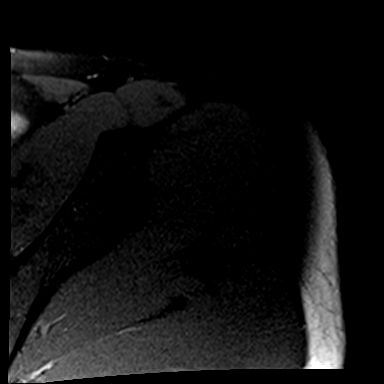

[Series 6: T1 · coronal · 5.0mm · 0.52mm/px · 3 of 19 slices shown]
[im 4/19]
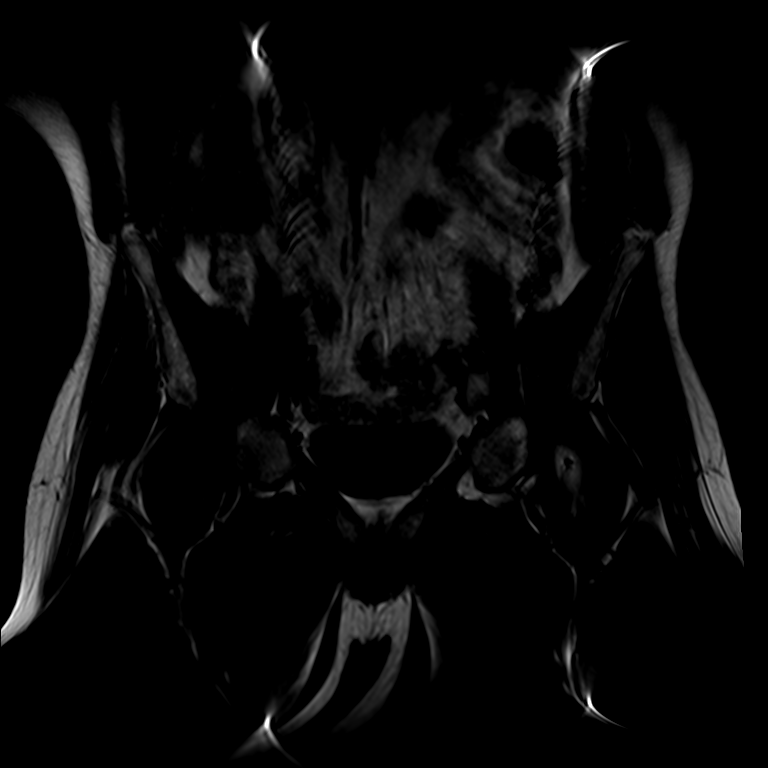
[im 10/19]
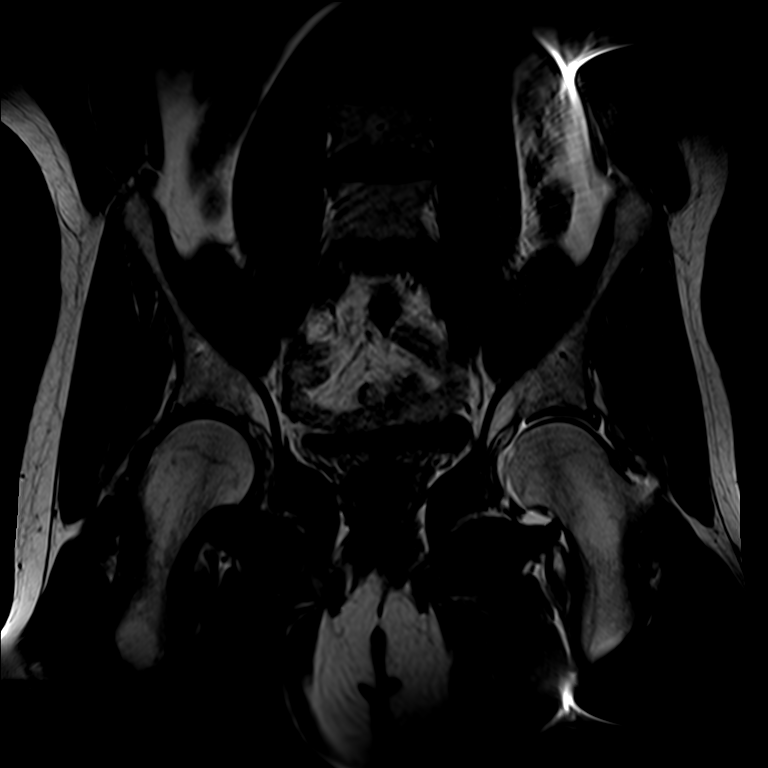
[im 16/19]
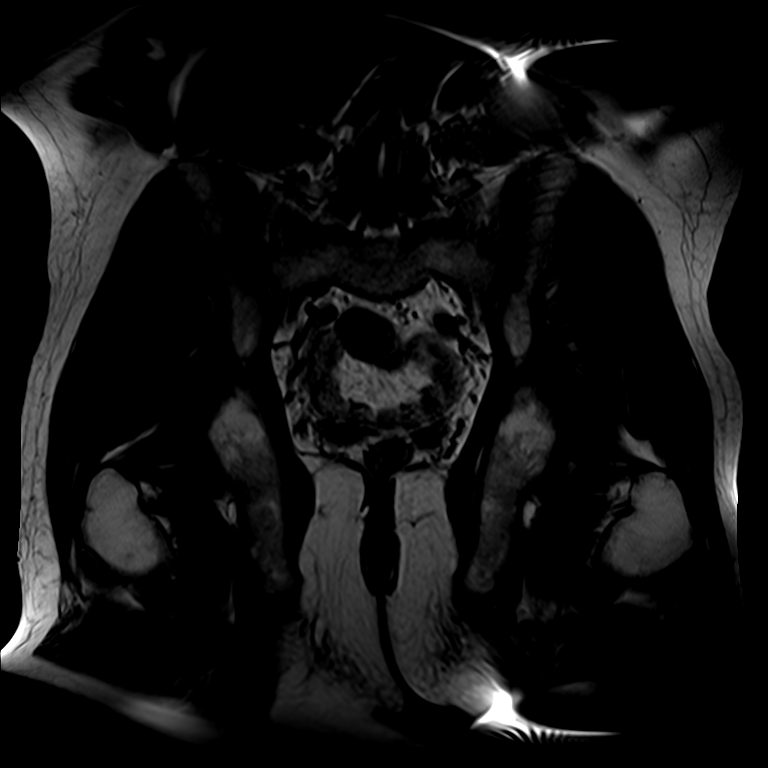

[Series 7: T2 fat-sat · coronal · 4.0mm · 0.62mm/px · 3 of 22 slices shown]
[im 4/22]
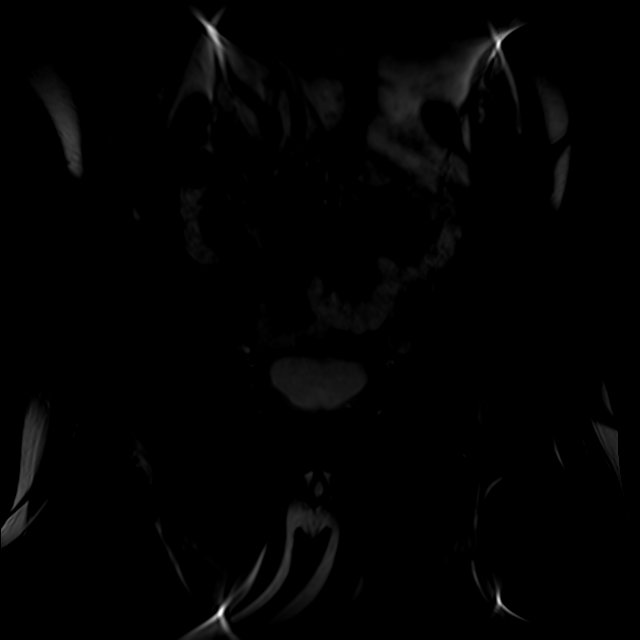
[im 13/22]
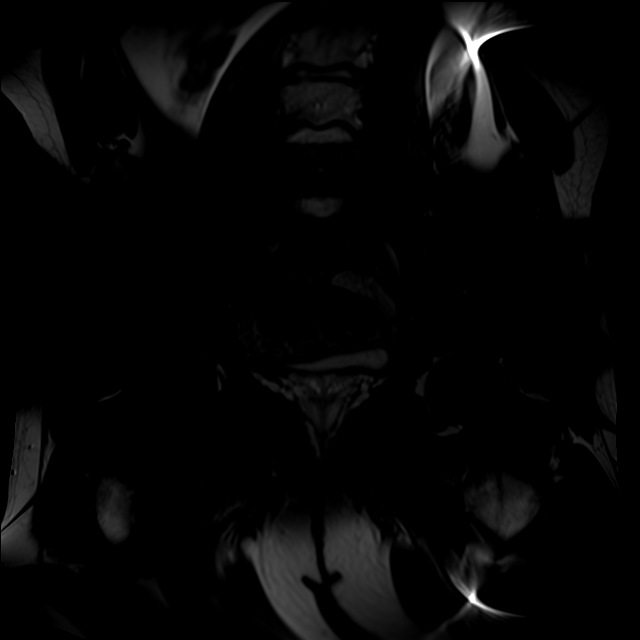
[im 19/22]
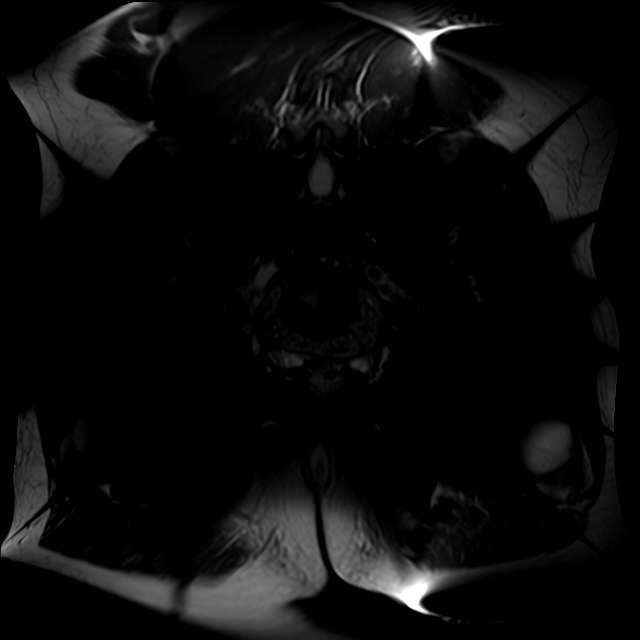

[21 of 40 positions shown; findings below may reference images not displayed]

FINDINGS: Bones: There is no evidence of acute fracture, dislocation or
avascular necrosis. The visualized bony pelvis appears normal. The
visualized sacroiliac joints and symphysis pubis appear normal.

Articular cartilage and labrum

Articular cartilage: No focal chondral defect or subchondral signal
abnormality identified.

Labrum: Loss of the normal triangular shape of the anterior superior
labrum with contrast extending into the labrum, consistent with
tear. Contrast also appears to fully extend between the anterior
labrum and adjacent acetabulum.

Joint or bursal effusion

Joint effusion: The left hip joint is distended with intra-articular
contrast. No right hip joint effusion.

Bursae: No focal periarticular fluid collection.

Muscles and tendons

Muscles and tendons: The visualized gluteus, hamstring and iliopsoas
tendons appear normal. The piriformis muscles appear symmetric.

Other findings

Miscellaneous: The visualized internal pelvic contents appear
unremarkable.
IMPRESSION: 1. Tear of the anterior superior labrum. Probable detachment of the
anterior labrum.

## 2018-11-13 IMAGING — XA DG FLUORO GUIDE NDL PLC/BX
2 series · 2 of 2 positions shown · non-contrast
Comparison: none

CLINICAL DATA: Inguinal pain, unspecified laterality. Left hip
pain.

[Series 1: ortho standard · 1 of 1 slices shown (1 of 2)]
[im 1/1]
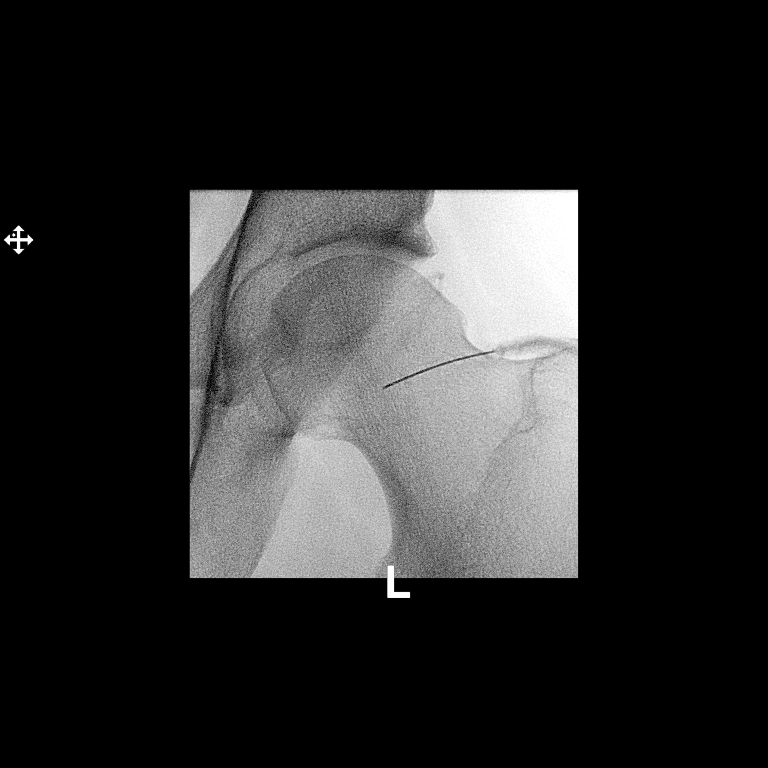

[Series 2: ortho standard · 1 of 1 slices shown (2 of 2)]
[im 1/1]
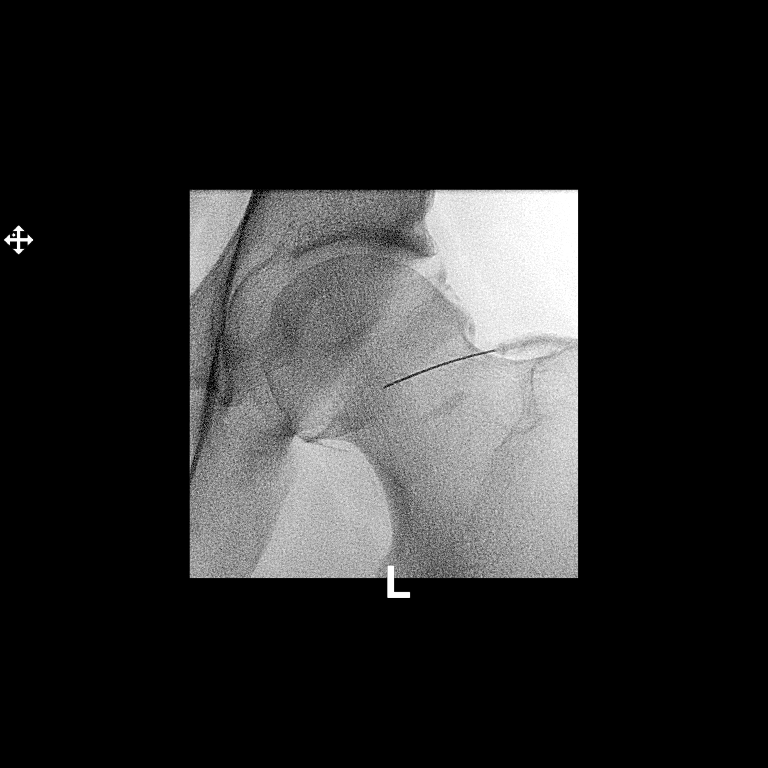

[2 of 2 positions shown; findings below may reference images not displayed]

FLUOROSCOPY TIME:  Radiation Exposure Index (as provided by the
fluoroscopic device): 6.56 uGy*m2

Fluoroscopy Time:  8 seconds

Number of Acquired Images:  0

PROCEDURE:
Left HIP INJECTION UNDER FLUOROSCOPY

An appropriate skin entrance site was determined. The site was
marked, prepped with Betadine, draped in the usual sterile fashion,
and infiltrated locally with 1% Lidocaine. A 22 gauge spinal needle
was advanced to the superolateral margin of the femoral head under
intermittent fluoroscopy. 1 ml of Lidocaine injected easily. A
mixture of 0.1 ml Multihance in 20 ml of dilute Isovue-M 200 was
then used to opacify the left hip joint. No immediate complication.
IMPRESSION: Technically successful left hip injection for MRI.

## 2020-07-22 ENCOUNTER — Encounter: Payer: Self-pay | Admitting: Dermatology

## 2020-07-22 ENCOUNTER — Other Ambulatory Visit: Payer: Self-pay

## 2020-07-22 ENCOUNTER — Ambulatory Visit (INDEPENDENT_AMBULATORY_CARE_PROVIDER_SITE_OTHER): Payer: 59 | Admitting: Dermatology

## 2020-07-22 DIAGNOSIS — B079 Viral wart, unspecified: Secondary | ICD-10-CM | POA: Diagnosis not present

## 2020-07-22 DIAGNOSIS — Z872 Personal history of diseases of the skin and subcutaneous tissue: Secondary | ICD-10-CM

## 2020-07-22 DIAGNOSIS — L814 Other melanin hyperpigmentation: Secondary | ICD-10-CM

## 2020-07-22 DIAGNOSIS — D1800 Hemangioma unspecified site: Secondary | ICD-10-CM

## 2020-07-22 DIAGNOSIS — Z86018 Personal history of other benign neoplasm: Secondary | ICD-10-CM | POA: Diagnosis not present

## 2020-07-22 DIAGNOSIS — D229 Melanocytic nevi, unspecified: Secondary | ICD-10-CM

## 2020-07-22 DIAGNOSIS — B353 Tinea pedis: Secondary | ICD-10-CM | POA: Diagnosis not present

## 2020-07-22 DIAGNOSIS — Z1283 Encounter for screening for malignant neoplasm of skin: Secondary | ICD-10-CM | POA: Diagnosis not present

## 2020-07-22 DIAGNOSIS — L700 Acne vulgaris: Secondary | ICD-10-CM | POA: Diagnosis not present

## 2020-07-22 DIAGNOSIS — L578 Other skin changes due to chronic exposure to nonionizing radiation: Secondary | ICD-10-CM

## 2020-07-22 DIAGNOSIS — L821 Other seborrheic keratosis: Secondary | ICD-10-CM

## 2020-07-22 MED ORDER — KETOCONAZOLE 2 % EX CREA
1.0000 | TOPICAL_CREAM | Freq: Every day | CUTANEOUS | 11 refills | Status: AC
Start: 2020-07-22 — End: 2022-07-12

## 2020-07-22 NOTE — Patient Instructions (Addendum)
Recommend using Cln Acne Wash daily, leave on for 1-2 minutes before rinsing off. This can be purchased at [employer] or online.  Viral Warts & Molluscum Contagiosum  Viral warts and molluscum contagiosum are growths of the skin caused by viral infection of the skin. If you have been given the diagnosis of viral warts or molluscum contagiosum there are a few things that you must understand about your condition:  1. There is no guaranteed treatment method available for this condition. 2. Multiple treatments may be required, 3. The treatments may be time consuming and require multiple visits to the dermatology office. 4. The treatment may be expensive. You will be charged each time you come into the office to have the spots treated. 5. The treated areas may develop new lesions further complicating treatment. 6. The treated areas may leave a scar. 7. There is no guarantee that even after multiple treatments that the spots will be successfully treated. 8. These are caused by a viral infection and can be spread to other areas of the skin and to other people by direct contact. Therefore, new spots may occur. 9.

## 2020-07-22 NOTE — Progress Notes (Signed)
   Follow-Up Visit   Subjective  Javier Burgess is a 34 y.o. male who presents for the following: Annual Exam (History of dysplastic nevi - TBSE today). The patient presents for Total-Body Skin Exam (TBSE) for skin cancer screening and mole check.  The following portions of the chart were reviewed this encounter and updated as appropriate:  Tobacco  Allergies  Meds  Problems  Med Hx  Surg Hx  Fam Hx     Review of Systems:  No other skin or systemic complaints except as noted in HPI or Assessment and Plan.  Objective  Well appearing patient in no apparent distress; mood and affect are within normal limits.  A full examination was performed including scalp, head, eyes, ears, nose, lips, neck, chest, axillae, abdomen, back, buttocks, bilateral upper extremities, bilateral lower extremities, hands, feet, fingers, toes, fingernails, and toenails. All findings within normal limits unless otherwise noted below.  Objective  Upper back: Small pink papules   Objective  Right dorsum wrist: Verrucous papules -- Discussed viral etiology and contagion.   Objective  Bilateral feet: Scaliness     Assessment & Plan    Lentigines - Scattered tan macules - Discussed due to sun exposure - Benign, observe - Call for any changes  Seborrheic Keratoses - Stuck-on, waxy, tan-brown papules and plaques  - Discussed benign etiology and prognosis. - Observe - Call for any changes  Melanocytic Nevi - Tan-brown and/or pink-flesh-colored symmetric macules and papules - Benign appearing on exam today - Observation - Call clinic for new or changing moles - Recommend daily use of broad spectrum spf 30+ sunscreen to sun-exposed areas.   Hemangiomas - Red papules - Discussed benign nature - Observe - Call for any changes  Actinic Damage - diffuse scaly erythematous macules with underlying dyspigmentation - Recommend daily broad spectrum sunscreen SPF 30+ to sun-exposed areas, reapply  every 2 hours as needed.  - Call for new or changing lesions.  Skin cancer screening performed today.  History of Dysplastic Nevi - No evidence of recurrence today - Recommend regular full body skin exams - Recommend daily broad spectrum sunscreen SPF 30+ to sun-exposed areas, reapply every 2 hours as needed.  - Call if any new or changing lesions are noted between office visits  History of eyelid dermatitis  -Clear today.  -Continue Elidel prn -May consider patch testing in the future  Acne vulgaris Back Recommend Cln acne wash  Viral warts, unspecified type Right dorsum wrist  Destruction of lesion - Right dorsum wrist Complexity: simple   Destruction method: cryotherapy   Informed consent: discussed and consent obtained   Timeout:  patient name, date of birth, surgical site, and procedure verified Lesion destroyed using liquid nitrogen: Yes   Region frozen until ice ball extended beyond lesion: Yes   Outcome: patient tolerated procedure well with no complications   Post-procedure details: wound care instructions given    Tinea pedis of both feet Bilateral feet Start Ketoconazole 2% cream to bilateral feet qhs  ketoconazole (NIZORAL) 2 % cream - Bilateral feet  Return in about 1 year (around 07/22/2021).   I, Ashok Cordia, CMA, am acting as scribe for Sarina Ser, MD .  Documentation: I have reviewed the above documentation for accuracy and completeness, and I agree with the above.  Sarina Ser, MD

## 2021-07-29 ENCOUNTER — Encounter: Payer: 59 | Admitting: Dermatology

## 2021-09-09 ENCOUNTER — Ambulatory Visit: Payer: 59 | Admitting: Dermatology

## 2021-12-16 ENCOUNTER — Ambulatory Visit: Payer: 59 | Admitting: Dermatology

## 2022-01-19 ENCOUNTER — Ambulatory Visit (INDEPENDENT_AMBULATORY_CARE_PROVIDER_SITE_OTHER): Payer: 59 | Admitting: Dermatology

## 2022-01-19 DIAGNOSIS — L578 Other skin changes due to chronic exposure to nonionizing radiation: Secondary | ICD-10-CM

## 2022-01-19 DIAGNOSIS — B079 Viral wart, unspecified: Secondary | ICD-10-CM

## 2022-01-19 DIAGNOSIS — L814 Other melanin hyperpigmentation: Secondary | ICD-10-CM

## 2022-01-19 DIAGNOSIS — Z1283 Encounter for screening for malignant neoplasm of skin: Secondary | ICD-10-CM | POA: Diagnosis not present

## 2022-01-19 DIAGNOSIS — D229 Melanocytic nevi, unspecified: Secondary | ICD-10-CM

## 2022-01-19 DIAGNOSIS — Z86018 Personal history of other benign neoplasm: Secondary | ICD-10-CM | POA: Diagnosis not present

## 2022-01-19 DIAGNOSIS — D1800 Hemangioma unspecified site: Secondary | ICD-10-CM

## 2022-01-19 DIAGNOSIS — L821 Other seborrheic keratosis: Secondary | ICD-10-CM

## 2022-01-19 NOTE — Progress Notes (Signed)
? ?  Follow-Up Visit ?  ?Subjective  ?Javier Burgess is a 36 y.o. male who presents for the following: Total body skin exam (Hx of dysplastic nevi). ?The patient presents for Total-Body Skin Exam (TBSE) for skin cancer screening and mole check.  The patient has spots, moles and lesions to be evaluated, some may be new or changing and the patient has concerns that these could be cancer.  ? ?The following portions of the chart were reviewed this encounter and updated as appropriate:  ? Tobacco  Allergies  Meds  Problems  Med Hx  Surg Hx  Fam Hx   ?  ?Review of Systems:  No other skin or systemic complaints except as noted in HPI or Assessment and Plan. ? ?Objective  ?Well appearing patient in no apparent distress; mood and affect are within normal limits. ? ?A full examination was performed including scalp, head, eyes, ears, nose, lips, neck, chest, axillae, abdomen, back, buttocks, bilateral upper extremities, bilateral lower extremities, hands, feet, fingers, toes, fingernails, and toenails. All findings within normal limits unless otherwise noted below. ? ?L sup lat pectoral, L infra pectoral superior, L infra pectoral inferior, R mid back 4.0cm lat to spine ?Scars with no evidence of recurrence.  ? ?R and L hands ?Verrucous papules -- Discussed viral etiology and contagion.  ? ? ?Assessment & Plan  ? ?Lentigines ?- Scattered tan macules ?- Due to sun exposure ?- Benign-appearing, observe ?- Recommend daily broad spectrum sunscreen SPF 30+ to sun-exposed areas, reapply every 2 hours as needed. ?- Call for any changes ? ?Seborrheic Keratoses ?- Stuck-on, waxy, tan-brown papules and/or plaques  ?- Benign-appearing ?- Discussed benign etiology and prognosis. ?- Observe ?- Call for any changes ? ?Melanocytic Nevi ?- Tan-brown and/or pink-flesh-colored symmetric macules and papules ?- Benign appearing on exam today ?- Observation ?- Call clinic for new or changing moles ?- Recommend daily use of broad spectrum spf  30+ sunscreen to sun-exposed areas.  ? ?Hemangiomas ?- Red papules ?- Discussed benign nature ?- Observe ?- Call for any changes ? ?Actinic Damage ?- Chronic condition, secondary to cumulative UV/sun exposure ?- diffuse scaly erythematous macules with underlying dyspigmentation ?- Recommend daily broad spectrum sunscreen SPF 30+ to sun-exposed areas, reapply every 2 hours as needed.  ?- Staying in the shade or wearing long sleeves, sun glasses (UVA+UVB protection) and wide brim hats (4-inch brim around the entire circumference of the hat) are also recommended for sun protection.  ?- Call for new or changing lesions. ? ?Skin cancer screening performed today. ? ?History of dysplastic nevus ?L sup lat pectoral, L infra pectoral superior, L infra pectoral inferior, R mid back 4.0cm lat to spine ? ?Clear. Observe for recurrence. Call clinic for new or changing lesions.  Recommend regular skin exams, daily broad-spectrum spf 30+ sunscreen use, and photoprotection.   ? ?Viral warts, unspecified type ?R and L hands ? ?Discussed viral etiology and risk of spread.  Discussed multiple treatments may be required to clear warts.  Discussed possible post-treatment dyspigmentation and risk of recurrence. ? ?Pt declines treatment ?Discussed otc Sal acid treatment ? ?Skin cancer screening ? ? ?Return in about 1 year (around 01/20/2023) for TBSE, Hx of Dysplastic nevi. ? ?I, Othelia Pulling, RMA, am acting as scribe for Sarina Ser, MD . ?Documentation: I have reviewed the above documentation for accuracy and completeness, and I agree with the above. ? ?Sarina Ser, MD ? ?

## 2022-01-19 NOTE — Patient Instructions (Signed)

## 2022-01-28 ENCOUNTER — Encounter: Payer: Self-pay | Admitting: Dermatology

## 2023-01-25 ENCOUNTER — Ambulatory Visit (INDEPENDENT_AMBULATORY_CARE_PROVIDER_SITE_OTHER): Payer: 59 | Admitting: Dermatology

## 2023-01-25 VITALS — BP 117/75 | HR 80

## 2023-01-25 DIAGNOSIS — Z79899 Other long term (current) drug therapy: Secondary | ICD-10-CM

## 2023-01-25 DIAGNOSIS — L821 Other seborrheic keratosis: Secondary | ICD-10-CM

## 2023-01-25 DIAGNOSIS — Z1283 Encounter for screening for malignant neoplasm of skin: Secondary | ICD-10-CM

## 2023-01-25 DIAGNOSIS — L578 Other skin changes due to chronic exposure to nonionizing radiation: Secondary | ICD-10-CM

## 2023-01-25 DIAGNOSIS — B353 Tinea pedis: Secondary | ICD-10-CM

## 2023-01-25 DIAGNOSIS — L219 Seborrheic dermatitis, unspecified: Secondary | ICD-10-CM

## 2023-01-25 DIAGNOSIS — D229 Melanocytic nevi, unspecified: Secondary | ICD-10-CM

## 2023-01-25 DIAGNOSIS — L814 Other melanin hyperpigmentation: Secondary | ICD-10-CM

## 2023-01-25 DIAGNOSIS — Z86018 Personal history of other benign neoplasm: Secondary | ICD-10-CM

## 2023-01-25 MED ORDER — KETOCONAZOLE 2 % EX SHAM
MEDICATED_SHAMPOO | CUTANEOUS | 11 refills | Status: DC
Start: 2023-01-25 — End: 2024-01-31

## 2023-01-25 MED ORDER — KETOCONAZOLE 2 % EX CREA
TOPICAL_CREAM | CUTANEOUS | 11 refills | Status: DC
Start: 2023-01-25 — End: 2024-01-31

## 2023-01-25 NOTE — Progress Notes (Signed)
Follow-Up Visit   Subjective  Javier Burgess is a 37 y.o. male who presents for the following: Skin Cancer Screening and Full Body Skin Exam Hx of dysplastic nevi The patient presents for Total-Body Skin Exam (TBSE) for skin cancer screening and mole check. The patient has spots, moles and lesions to be evaluated, some may be new or changing and the patient has concerns that these could be cancer.  The following portions of the chart were reviewed this encounter and updated as appropriate: medications, allergies, medical history  Review of Systems:  No other skin or systemic complaints except as noted in HPI or Assessment and Plan.  Objective  Well appearing patient in no apparent distress; mood and affect are within normal limits.  A full examination was performed including scalp, head, eyes, ears, nose, lips, neck, chest, axillae, abdomen, back, buttocks, bilateral upper extremities, bilateral lower extremities, hands, feet, fingers, toes, fingernails, and toenails. All findings within normal limits unless otherwise noted below.   Relevant physical exam findings are noted in the Assessment and Plan.   Assessment & Plan   LENTIGINES, SEBORRHEIC KERATOSES, HEMANGIOMAS - Benign normal skin lesions - Benign-appearing - Call for any changes  MELANOCYTIC NEVI - Tan-brown and/or pink-flesh-colored symmetric macules and papules - Benign appearing on exam today - Observation - Call clinic for new or changing moles - Recommend daily use of broad spectrum spf 30+ sunscreen to sun-exposed areas.   TINEA PEDIS Exam: Scaling and maceration web spaces and over distal and lateral soles. Chronic and persistent condition with duration or expected duration over one year. Condition is symptomatic / bothersome to patient. Not to goal. Treatment Plan: Start ketoconazole 2 % cream - apply topically to feet qhs for several months until clear  SEBORRHEIC DERMATITIS Exam: Pink patches with greasy  scale at scalp Chronic and persistent condition with duration or expected duration over one year. Condition is bothersome/symptomatic for patient. Currently flared. Seborrheic Dermatitis is a chronic persistent rash characterized by pinkness and scaling most commonly of the mid face but also can occur on the scalp (dandruff), ears; mid chest, mid back and groin.  It tends to be exacerbated by stress and cooler weather.  People who have neurologic disease may experience new onset or exacerbation of existing seborrheic dermatitis.  The condition is not curable but treatable and can be controlled. Treatment Plan: Start ketoconazole shampoo 2% use 2 - 3 times per week.  massage into scalp and leave in for 10 minutes before rinsing out  ACTINIC DAMAGE - Chronic condition, secondary to cumulative UV/sun exposure - diffuse scaly erythematous macules with underlying dyspigmentation - Recommend daily broad spectrum sunscreen SPF 30+ to sun-exposed areas, reapply every 2 hours as needed.  - Staying in the shade or wearing long sleeves, sun glasses (UVA+UVB protection) and wide brim hats (4-inch brim around the entire circumference of the hat) are also recommended for sun protection.  - Call for new or changing lesions.  HISTORY OF DYSPLASTIC NEVUS Multiple locations see history  No evidence of recurrence today Recommend regular full body skin exams Recommend daily broad spectrum sunscreen SPF 30+ to sun-exposed areas, reapply every 2 hours as needed.  Call if any new or changing lesions are noted between office visits  SKIN CANCER SCREENING PERFORMED TODAY.  Tinea pedis of both feet  Related Medications ketoconazole (NIZORAL) 2 % cream Apply topically to both feet qhs  Seborrheic dermatitis  Related Medications ketoconazole (NIZORAL) 2 % shampoo apply 2 - 3 times  per week, massage into scalp and leave in for 10 minutes before rinsing out   Return in about 1 year (around 01/25/2024) for  TBSE.  IAsher Muir, CMA, am acting as scribe for Armida Sans, MD.  Documentation: I have reviewed the above documentation for accuracy and completeness, and I agree with the above.  Armida Sans, MD

## 2023-01-25 NOTE — Patient Instructions (Addendum)
For flakiness in scalp  Ketoconazole shampoo use 2 - 3 times per week  massage into scalp and leave for at least 5 - 10 minutes before rinsing out   For flakiness at feet.  Start ketoconazole 2 % cream - apply nightly to both feet for fungus.  For several months until clear   Melanoma ABCDEs  Melanoma is the most dangerous type of skin cancer, and is the leading cause of death from skin disease.  You are more likely to develop melanoma if you: Have light-colored skin, light-colored eyes, or red or blond hair Spend a lot of time in the sun Tan regularly, either outdoors or in a tanning bed Have had blistering sunburns, especially during childhood Have a close family member who has had a melanoma Have atypical moles or large birthmarks  Early detection of melanoma is key since treatment is typically straightforward and cure rates are extremely high if we catch it early.   The first sign of melanoma is often a change in a mole or a new dark spot.  The ABCDE system is a way of remembering the signs of melanoma.  A for asymmetry:  The two halves do not match. B for border:  The edges of the growth are irregular. C for color:  A mixture of colors are present instead of an even brown color. D for diameter:  Melanomas are usually (but not always) greater than 6mm - the size of a pencil eraser. E for evolution:  The spot keeps changing in size, shape, and color.  Please check your skin once per month between visits. You can use a small mirror in front and a large mirror behind you to keep an eye on the back side or your body.   If you see any new or changing lesions before your next follow-up, please call to schedule a visit.  Please continue daily skin protection including broad spectrum sunscreen SPF 30+ to sun-exposed areas, reapplying every 2 hours as needed when you're outdoors.   Staying in the shade or wearing long sleeves, sun glasses (UVA+UVB protection) and wide brim hats  (4-inch brim around the entire circumference of the hat) are also recommended for sun protection.    Due to recent changes in healthcare laws, you may see results of your pathology and/or laboratory studies on MyChart before the doctors have had a chance to review them. We understand that in some cases there may be results that are confusing or concerning to you. Please understand that not all results are received at the same time and often the doctors may need to interpret multiple results in order to provide you with the best plan of care or course of treatment. Therefore, we ask that you please give [place] 2 business days to thoroughly review all your results before contacting the office for clarification. Should we see a critical lab result, you will be contacted sooner.   If You Need Anything After Your Visit  If you have any questions or concerns for your doctor, please call our main line at [phone] and press option 4 to reach your doctor's medical assistant. If no one answers, please leave a voicemail as directed and we will return your call as soon as possible. Messages left after 4 pm will be answered the following business day.   You may also send [place] a message via MyChart. We typically respond to MyChart messages within 1-2 business days.  For prescription refills, please ask your pharmacy to contact  our office. Our fax number is [phone].  If you have an urgent issue when the clinic is closed that cannot wait until the next business day, you can page your doctor at the number below.    Please note that while we do our best to be available for urgent issues outside of office hours, we are not available 24/7.   If you have an urgent issue and are unable to reach [place], you may choose to seek medical care at your doctor's office, retail clinic, urgent care center, or emergency room.  If you have a medical emergency, please immediately call 911 or go to the emergency department.  Pager  Numbers  - Dr. Gwen Pounds: [phone]  - Dr. Neale Burly: [phone]  - Dr. Roseanne Reno: [phone]  In the event of inclement weather, please call our main line at [phone] for an update on the status of any delays or closures.  Dermatology Medication Tips: Please keep the boxes that topical medications come in in order to help keep track of the instructions about where and how to use these. Pharmacies typically print the medication instructions only on the boxes and not directly on the medication tubes.   If your medication is too expensive, please contact our office at [phone] option 4 or send [place] a message through MyChart.   We are unable to tell what your co-pay for medications will be in advance as this is different depending on your insurance coverage. However, we may be able to find a substitute medication at lower cost or fill out paperwork to get insurance to cover a needed medication.   If a prior authorization is required to get your medication covered by your insurance company, please allow [place] 1-2 business days to complete this process.  Drug prices often vary depending on where the prescription is filled and some pharmacies may offer cheaper prices.  The website www.goodrx.com contains coupons for medications through different pharmacies. The prices here do not account for what the cost may be with help from insurance (it may be cheaper with your insurance), but the website can give you the price if you did not use any insurance.  - You can print the associated coupon and take it with your prescription to the pharmacy.  - You may also stop by our office during regular business hours and pick up a GoodRx coupon card.  - If you need your prescription sent electronically to a different pharmacy, notify our office through [employer] or by phone at [phone] option 4.     Si Usted Necesita Algo Despus de Su Visita  Tambin puede enviarnos un mensaje a travs de  Clinical cytogeneticist. Por lo general respondemos a los mensajes de MyChart en el transcurso de 1 a 2 das hbiles.  Para renovar recetas, por favor pida a su farmacia que se ponga en contacto con nuestra oficina. Annie Sable de fax es [place] [phone].  Si tiene un asunto urgente cuando la clnica est cerrada y que no puede esperar hasta el siguiente da hbil, puede llamar/localizar a su doctor(a) al nmero que aparece a continuacin.   Por favor, tenga en cuenta que aunque hacemos todo lo posible para estar disponibles para asuntos urgentes fuera del horario de [place], no estamos disponibles las 24 horas del da, los 7 [address]  [address] de la [place].   Si tiene un problema urgente y no puede comunicarse con nosotros, puede optar por buscar atencin mdica  en el consultorio de su doctor(a), en una clnica privada,  en un centro de atencin urgente o en una sala de emergencias.  Si tiene Engineer, drilling, por favor llame inmediatamente al 911 o vaya a la sala de emergencias.  Nmeros de bper  - Dr. Gwen Pounds: [phone]  - Dra. Moye: [phone]  - Dra. Roseanne Reno: [phone]  En caso de inclemencias del [place], por favor llame a Lacy Duverney principal al [phone] para una actualizacin sobre el [place] de cualquier retraso o cierre.  Consejos para la medicacin en dermatologa: Por favor, guarde las cajas en las que vienen los medicamentos de uso tpico para ayudarle a seguir las instrucciones sobre dnde y cmo usarlos. Las farmacias generalmente imprimen las instrucciones del medicamento slo en las cajas y no directamente en los tubos del [place].   Si su medicamento es muy caro, por favor, pngase en contacto con Rolm Gala llamando al [phone] y presione la opcin 4 o envenos un mensaje a travs de Clinical cytogeneticist.   No podemos decirle cul ser su copago por los medicamentos por adelantado ya que esto es diferente dependiendo de la cobertura de su seguro. Sin embargo, es posible que  podamos encontrar un medicamento sustituto a Audiological scientist un formulario para que el seguro cubra el medicamento que se considera necesario.   Si se requiere una autorizacin previa para que su compaa de seguros [place] su medicamento, por favor permtanos de 1 a 2 das hbiles para completar [address]  Los precios de los medicamentos varan con frecuencia dependiendo del Environmental consultant de dnde se surte la receta y alguna farmacias pueden ofrecer precios ms baratos.  El sitio web www.goodrx.com tiene cupones para medicamentos de Health and safety inspector. Los precios aqu no tienen en cuenta lo que podra costar con la ayuda del seguro (puede ser ms barato con su seguro), pero el sitio web puede darle el precio si no utiliz Tourist information centre manager.  - Puede imprimir el cupn correspondiente y llevarlo con su receta a la farmacia.  - Tambin puede pasar por nuestra oficina durante el horario de atencin regular y Education officer, museum una tarjeta de cupones de GoodRx.  - Si necesita que su receta se enve electrnicamente a una farmacia diferente, informe a nuestra oficina a travs de MyChart de [place] o por telfono llamando al [phone] y presione la opcin 4.

## 2023-01-31 ENCOUNTER — Encounter: Payer: Self-pay | Admitting: Dermatology

## 2024-01-31 ENCOUNTER — Encounter: Payer: Self-pay | Admitting: Dermatology

## 2024-01-31 ENCOUNTER — Ambulatory Visit: Payer: 59 | Admitting: Dermatology

## 2024-01-31 DIAGNOSIS — L219 Seborrheic dermatitis, unspecified: Secondary | ICD-10-CM

## 2024-01-31 DIAGNOSIS — W908XXA Exposure to other nonionizing radiation, initial encounter: Secondary | ICD-10-CM

## 2024-01-31 DIAGNOSIS — H01139 Eczematous dermatitis of unspecified eye, unspecified eyelid: Secondary | ICD-10-CM

## 2024-01-31 DIAGNOSIS — L820 Inflamed seborrheic keratosis: Secondary | ICD-10-CM | POA: Diagnosis not present

## 2024-01-31 DIAGNOSIS — Z7189 Other specified counseling: Secondary | ICD-10-CM

## 2024-01-31 DIAGNOSIS — B353 Tinea pedis: Secondary | ICD-10-CM

## 2024-01-31 DIAGNOSIS — Z86018 Personal history of other benign neoplasm: Secondary | ICD-10-CM

## 2024-01-31 DIAGNOSIS — L578 Other skin changes due to chronic exposure to nonionizing radiation: Secondary | ICD-10-CM

## 2024-01-31 DIAGNOSIS — Z1283 Encounter for screening for malignant neoplasm of skin: Secondary | ICD-10-CM | POA: Diagnosis not present

## 2024-01-31 DIAGNOSIS — D229 Melanocytic nevi, unspecified: Secondary | ICD-10-CM

## 2024-01-31 DIAGNOSIS — L814 Other melanin hyperpigmentation: Secondary | ICD-10-CM

## 2024-01-31 DIAGNOSIS — Z79899 Other long term (current) drug therapy: Secondary | ICD-10-CM

## 2024-01-31 DIAGNOSIS — L821 Other seborrheic keratosis: Secondary | ICD-10-CM

## 2024-01-31 DIAGNOSIS — D1801 Hemangioma of skin and subcutaneous tissue: Secondary | ICD-10-CM

## 2024-01-31 DIAGNOSIS — Z872 Personal history of diseases of the skin and subcutaneous tissue: Secondary | ICD-10-CM

## 2024-01-31 MED ORDER — KETOCONAZOLE 2 % EX CREA: TOPICAL_CREAM | CUTANEOUS | 11 refills | Status: AC

## 2024-01-31 MED ORDER — TERBINAFINE HCL 250 MG PO TABS: 250.0000 mg | ORAL_TABLET | Freq: Every day | ORAL | 0 refills | Status: AC

## 2024-01-31 MED ORDER — KETOCONAZOLE 2 % EX SHAM
MEDICATED_SHAMPOO | CUTANEOUS | 11 refills | Status: AC
Start: 2024-01-31 — End: ?

## 2024-01-31 NOTE — Patient Instructions (Addendum)
 Terbinafine Counseling  Terbinafine is an anti-fungal medicine that can be applied to the skin (over the counter) or taken by mouth (prescription) to treat fungal infections. The pill version is often used to treat fungal infections of the nails or scalp. While most people do not have any side effects from taking terbinafine pills, some possible side effects of the medicine can include taste changes, headache, loss of smell, vision changes, nausea, vomiting, or diarrhea.   Rare side effects can include irritation of the liver, allergic reaction, or decrease in blood counts (which may show up as not feeling well or developing an infection). If you are concerned about any of these side effects, please stop the medicine and call your doctor, or in the case of an emergency such as feeling very unwell, seek immediate medical care.      Seborrheic Keratosis  What causes seborrheic keratoses? Seborrheic keratoses are harmless, common skin growths that first appear during adult life.  As time goes by, more growths appear.  Some people may develop a large number of them.  Seborrheic keratoses appear on both covered and uncovered body parts.  They are not caused by sunlight.  The tendency to develop seborrheic keratoses can be inherited.  They vary in color from skin-colored to gray, brown, or even black.  They can be either smooth or have a rough, warty surface.   Seborrheic keratoses are superficial and look as if they were stuck on the skin.  Under the microscope this type of keratosis looks like layers upon layers of skin.  That is why at times the top layer may seem to fall off, but the rest of the growth remains and re-grows.    Treatment Seborrheic keratoses do not need to be treated, but can easily be removed in the office.  Seborrheic keratoses often cause symptoms when they rub on clothing or jewelry.  Lesions can be in the way of shaving.  If they become inflamed, they can cause itching, soreness,  or burning.  Removal of a seborrheic keratosis can be accomplished by freezing, burning, or surgery. If any spot bleeds, scabs, or grows rapidly, please return to have it checked, as these can be an indication of a skin cancer.   Cryotherapy Aftercare  Wash gently with soap and water everyday.   Apply Vaseline and Band-Aid daily until healed.     Melanoma ABCDEs  Melanoma is the most dangerous type of skin cancer, and is the leading cause of death from skin disease.  You are more likely to develop melanoma if you: Have light-colored skin, light-colored eyes, or red or blond hair Spend a lot of time in the sun Tan regularly, either outdoors or in a tanning bed Have had blistering sunburns, especially during childhood Have a close family member who has had a melanoma Have atypical moles or large birthmarks  Early detection of melanoma is key since treatment is typically straightforward and cure rates are extremely high if we catch it early.   The first sign of melanoma is often a change in a mole or a new dark spot.  The ABCDE system is a way of remembering the signs of melanoma.  A for asymmetry:  The two halves do not match. B for border:  The edges of the growth are irregular. C for color:  A mixture of colors are present instead of an even brown color. D for diameter:  Melanomas are usually (but not always) greater than 6mm - the size of  a pencil eraser. E for evolution:  The spot keeps changing in size, shape, and color.  Please check your skin once per month between visits. You can use a small mirror in front and a large mirror behind you to keep an eye on the back side or your body.   If you see any new or changing lesions before your next follow-up, please call to schedule a visit.  Please continue daily skin protection including broad spectrum sunscreen SPF 30+ to sun-exposed areas, reapplying every 2 hours as needed when you're outdoors.   Staying in the shade or wearing  long sleeves, sun glasses (UVA+UVB protection) and wide brim hats (4-inch brim around the entire circumference of the hat) are also recommended for sun protection.    Due to recent changes in healthcare laws, you may see results of your pathology and/or laboratory studies on MyChart before the doctors have had a chance to review them. We understand that in some cases there may be results that are confusing or concerning to you. Please understand that not all results are received at the same time and often the doctors may need to interpret multiple results in order to provide you with the best plan of care or course of treatment. Therefore, we ask that you please give us  2 business days to thoroughly review all your results before contacting the office for clarification. Should we see a critical lab result, you will be contacted sooner.   If You Need Anything After Your Visit  If you have any questions or concerns for your doctor, please call our main line at [phone] and press option 4 to reach your doctor's medical assistant. If no one answers, please leave a voicemail as directed and we will return your call as soon as possible. Messages left after 4 pm will be answered the following business day.   You may also send us  a message via MyChart. We typically respond to MyChart messages within 1-2 business days.  For prescription refills, please ask your pharmacy to contact our office. Our fax number is [phone].  If you have an urgent issue when the clinic is closed that cannot wait until the next business day, you can page your doctor at the number below.    Please note that while we do our best to be available for urgent issues outside of office hours, we are not available 24/7.   If you have an urgent issue and are unable to reach us , you may choose to seek medical care at your doctor's office, retail clinic, urgent care center, or emergency room.  If you have a medical emergency, please  immediately call 911 or go to the emergency department.  Pager Numbers  - Dr. Bary Likes: [phone]  - Dr. Annette Barters: [phone]  - Dr. Felipe Horton: [phone]   In the event of inclement weather, please call our main line at [phone] for an update on the status of any delays or closures.  Dermatology Medication Tips: Please keep the boxes that topical medications come in in order to help keep track of the instructions about where and how to use these. Pharmacies typically print the medication instructions only on the boxes and not directly on the medication tubes.   If your medication is too expensive, please contact our office at [phone] option 4 or send us  a message through MyChart.   We are unable to tell what your co-pay for medications will be in advance as this is different depending on your insurance  coverage. However, we may be able to find a substitute medication at lower cost or fill out paperwork to get insurance to cover a needed medication.   If a prior authorization is required to get your medication covered by your insurance company, please allow us  1-2 business days to complete this process.  Drug prices often vary depending on where the prescription is filled and some pharmacies may offer cheaper prices.  The website www.goodrx.com contains coupons for medications through different pharmacies. The prices here do not account for what the cost may be with help from insurance (it may be cheaper with your insurance), but the website can give you the price if you did not use any insurance.  - You can print the associated coupon and take it with your prescription to the pharmacy.  - You may also stop by our office during regular business hours and pick up a GoodRx coupon card.  - If you need your prescription sent electronically to a different pharmacy, notify our office through [facility] or by phone at [phone] option 4.     Si Usted Necesita Algo  Despus de Su Visita  Tambin puede enviarnos un mensaje a travs de Clinical cytogeneticist. Por lo general respondemos a los mensajes de MyChart en el transcurso de 1 a 2 das hbiles.  Para renovar recetas, por favor pida a su farmacia que se ponga en contacto con nuestra oficina. Franz Jacks de fax es [place] [phone].  Si tiene un asunto urgente cuando la clnica est cerrada y que no puede esperar hasta el siguiente da hbil, puede llamar/localizar a su doctor(a) al nmero que aparece a continuacin.   Por favor, tenga en cuenta que aunque hacemos todo lo posible para estar disponibles para asuntos urgentes fuera del horario de [place], no estamos disponibles las 24 horas del da, los 7 [address]  [address] de la [place].   Si tiene un problema urgente y no puede comunicarse con nosotros, puede optar por buscar atencin mdica  en el consultorio de su doctor(a), en una clnica privada, en un centro de atencin urgente o en una sala de emergencias.  Si tiene Engineer, drilling, por favor llame inmediatamente al 911 o vaya a la sala de emergencias.  Nmeros de bper  - Dr. Bary Likes: [phone]  - Dra. Annette Barters: [phone]  - Dr. Felipe Horton: [phone]   En caso de inclemencias del tiempo, por favor llame a Lajuan Pila principal al [phone] para una actualizacin sobre el [place] de cualquier retraso o cierre.  Consejos para la medicacin en dermatologa: Por favor, guarde las cajas en las que vienen los medicamentos de uso tpico para ayudarle a seguir las instrucciones sobre dnde y cmo usarlos. Las farmacias generalmente imprimen las instrucciones del medicamento slo en las cajas y no directamente en los tubos del [place].   Si su medicamento es muy caro, por favor, pngase en contacto con Bettyjane Brunet llamando al [phone] y presione la opcin 4 o envenos un mensaje a travs de Clinical cytogeneticist.   No podemos decirle cul ser su copago por los medicamentos por adelantado ya que esto es diferente  dependiendo de la cobertura de su seguro. Sin embargo, es posible que podamos encontrar un medicamento sustituto a Audiological scientist un formulario para que el seguro cubra el medicamento que se considera necesario.   Si se requiere una autorizacin previa para que su compaa de seguros [place] su medicamento, por favor permtanos de 1 a 2 das hbiles para completar este proceso.  Los  precios de los medicamentos varan con frecuencia dependiendo del lugar de dnde se surte la receta y alguna farmacias pueden ofrecer precios ms baratos.  El sitio web www.goodrx.com tiene cupones para medicamentos de Health and safety inspector. Los precios aqu no tienen en cuenta lo que podra costar con la ayuda del seguro (puede ser ms barato con su seguro), pero el sitio web puede darle el precio si no utiliz Tourist information centre manager.  - Puede imprimir el cupn correspondiente y llevarlo con su receta a la farmacia.  - Tambin puede pasar por nuestra oficina durante el horario de atencin regular y Education officer, museum una tarjeta de cupones de GoodRx.  - Si necesita que su receta se enve electrnicamente a una farmacia diferente, informe a nuestra oficina a travs de MyChart de [place] o por telfono llamando al [phone] y presione la opcin 4.

## 2024-01-31 NOTE — Progress Notes (Signed)
 Follow-Up Visit   Subjective  Javier Burgess is a 38 y.o. male who presents for the following: Skin Cancer Screening and Full Body Skin Exam Hx of multiple dysplastic nevi, hx of seb derm using ketoconazole  shampoo, hx of tinea pedis using ketoconazole  cream  The patient presents for Total-Body Skin Exam (TBSE) for skin cancer screening and mole check. The patient has spots, moles and lesions to be evaluated, some may be new or changing and the patient may have concern these could be cancer.  The following portions of the chart were reviewed this encounter and updated as appropriate: medications, allergies, medical history  Review of Systems:  No other skin or systemic complaints except as noted in HPI or Assessment and Plan.  Objective  Well appearing patient in no apparent distress; mood and affect are within normal limits.  A full examination was performed including scalp, head, eyes, ears, nose, lips, neck, chest, axillae, abdomen, back, buttocks, bilateral upper extremities, bilateral lower extremities, hands, feet, fingers, toes, fingernails, and toenails. All findings within normal limits unless otherwise noted below.   Relevant physical exam findings are noted in the Assessment and Plan.  right forehead x 1 Erythematous stuck-on, waxy papule or plaque  Assessment & Plan   SKIN CANCER SCREENING PERFORMED TODAY.  ACTINIC DAMAGE - Chronic condition, secondary to cumulative UV/sun exposure - diffuse scaly erythematous macules with underlying dyspigmentation - Recommend daily broad spectrum sunscreen SPF 30+ to sun-exposed areas, reapply every 2 hours as needed.  - Staying in the shade or wearing long sleeves, sun glasses (UVA+UVB protection) and wide brim hats (4-inch brim around the entire circumference of the hat) are also recommended for sun protection.  - Call for new or changing lesions.  LENTIGINES, SEBORRHEIC KERATOSES, HEMANGIOMAS - Benign normal skin lesions -  Benign-appearing - Call for any changes  MELANOCYTIC NEVI - Tan-brown and/or pink-flesh-colored symmetric macules and papules - Benign appearing on exam today - Observation - Call clinic for new or changing moles - Recommend daily use of broad spectrum spf 30+ sunscreen to sun-exposed areas.   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 Mild atypia latera and deep margins involved  12/21/2015 - right mid back 4 cm lateral to spine - moderate to severe atypia close to margin excised 01/12/2016 margins free No evidence of recurrence today Recommend regular full body skin exams Recommend daily broad spectrum sunscreen SPF 30+ to sun-exposed areas, reapply every 2 hours as needed.  Call if any new or changing lesions are noted between office visits  TINEA PEDIS Exam: Scaling and maceration web spaces and over distal and lateral soles. Chronic and persistent condition with duration or expected duration over one year. Condition is symptomatic / bothersome to patient. Not to goal. Treatment Plan: Continue  ketoconazole  2 % cream - apply topically to feet qhs for several months until clear Discuss oral treatment - patient  Reviewed CMP labs from 05/25/2023 all within normal limits Start Lamisil 250 mg tab take 1 po by mouth for 1 month   Terbinafine Counseling Terbinafine is an anti-fungal medicine that can be applied to the skin (over the counter) or taken by mouth (prescription) to treat fungal infections. The pill version is often used to treat fungal infections of the nails or scalp. While most people do not have any side effects  from taking terbinafine pills, some possible side effects of the medicine can include taste changes, headache, loss of smell, vision changes, nausea, vomiting, or  diarrhea.   Rare side effects can include irritation of the liver, allergic reaction, or decrease in blood counts (which may show up as not feeling well or developing an infection). If you are concerned about any of these side effects, please stop the medicine and call your doctor, or in the case of an emergency such as feeling very unwell, seek immediate medical care.    SEBORRHEIC DERMATITIS Exam: Pink patches with greasy scale at scalp Chronic and persistent condition with duration or expected duration over one year. Condition is improving with treatment but not currently at goal. Seborrheic Dermatitis is a chronic persistent rash characterized by pinkness and scaling most commonly of the mid face but also can occur on the scalp (dandruff), ears; mid chest, mid back and groin.  It tends to be exacerbated by stress and cooler weather.  People who have neurologic disease may experience new onset or exacerbation of existing seborrheic dermatitis.  The condition is not curable but treatable and can be controlled. Treatment Plan: Continue ketoconazole  shampoo 2% use 2 - 3 times per week.  massage into scalp and leave in for 10 minutes before rinsing out  History of eyelid dermatitis  -Clear today.  -Continue Elidel prn -Discussed could consider patch testing in the future Patient declined testing today   TINEA PEDIS OF BOTH FEET   Related Medications terbinafine (LAMISIL) 250 MG tablet Take 1 tablet (250 mg total) by mouth daily. ketoconazole  (NIZORAL ) 2 % cream Apply topically to both feet qhs SEBORRHEIC DERMATITIS   Related Medications ketoconazole  (NIZORAL ) 2 % shampoo apply 2 - 3 times per week, massage into scalp and leave in for 10 minutes before rinsing out INFLAMED SEBORRHEIC KERATOSIS right forehead x 1 Symptomatic, irritating, patient would like treated.  Will recheck at next follow up Destruction of lesion - right forehead x 1 Complexity: simple   Destruction method:  cryotherapy   Informed consent: discussed and consent obtained   Timeout:  patient name, date of birth, surgical site, and procedure verified Lesion destroyed using liquid nitrogen: Yes   Region frozen until ice ball extended beyond lesion: Yes   Outcome: patient tolerated procedure well with no complications   Post-procedure details: wound care instructions given   Return in about 1 year (around 01/30/2025) for TBSE.  IRandee Busing, CMA, am acting as scribe for Celine Collard, MD.   Documentation: I have reviewed the above documentation for accuracy and completeness, and I agree with the above.  Celine Collard, MD

## 2025-02-11 ENCOUNTER — Ambulatory Visit: Admitting: Dermatology
